# Patient Record
Sex: Female | Born: 1955 | Race: Black or African American | Hispanic: No | Marital: Single | Smoking: Never smoker
Health system: Southern US, Community
[De-identification: ages and names within clinical notes are randomized; demographics above are authoritative.]

## PROBLEM LIST (undated history)

## (undated) DIAGNOSIS — J45909 Unspecified asthma, uncomplicated: Secondary | ICD-10-CM

## (undated) HISTORY — PX: ABDOMINAL HYSTERECTOMY: SHX81

---

## 2004-07-04 ENCOUNTER — Emergency Department: Payer: Self-pay | Admitting: Emergency Medicine

## 2005-08-09 ENCOUNTER — Ambulatory Visit: Payer: Self-pay | Admitting: Family Medicine

## 2006-11-21 ENCOUNTER — Ambulatory Visit: Payer: Self-pay | Admitting: Internal Medicine

## 2009-06-01 ENCOUNTER — Ambulatory Visit: Payer: Self-pay | Admitting: Unknown Physician Specialty

## 2009-07-08 ENCOUNTER — Ambulatory Visit: Payer: Self-pay | Admitting: Unknown Physician Specialty

## 2009-07-13 ENCOUNTER — Ambulatory Visit: Payer: Self-pay | Admitting: Unknown Physician Specialty

## 2009-11-27 ENCOUNTER — Emergency Department: Payer: Self-pay | Admitting: Emergency Medicine

## 2010-08-15 ENCOUNTER — Ambulatory Visit: Payer: Self-pay | Admitting: Family Medicine

## 2011-04-20 ENCOUNTER — Ambulatory Visit: Payer: Self-pay
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
## 2011-04-23 ENCOUNTER — Emergency Department: Payer: Self-pay | Admitting: Emergency Medicine

## 2011-11-10 ENCOUNTER — Emergency Department: Payer: Self-pay | Admitting: Emergency Medicine
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
## 2012-05-24 ENCOUNTER — Emergency Department: Payer: Self-pay | Admitting: Internal Medicine

## 2013-12-15 ENCOUNTER — Ambulatory Visit: Payer: Self-pay | Admitting: Family Medicine

## 2014-04-19 ENCOUNTER — Emergency Department: Payer: Self-pay | Admitting: Emergency Medicine

## 2014-04-19 LAB — COMPREHENSIVE METABOLIC PANEL
Albumin: 3.7 g/dL (ref 3.4–5.0)
Alkaline Phosphatase: 104 U/L
Anion Gap: 10 (ref 7–16)
BUN: 11 mg/dL (ref 7–18)
Bilirubin,Total: 0.9 mg/dL (ref 0.2–1.0)
CREATININE: 1.47 mg/dL — AB (ref 0.60–1.30)
Calcium, Total: 12.3 mg/dL — ABNORMAL HIGH (ref 8.5–10.1)
Chloride: 111 mmol/L — ABNORMAL HIGH (ref 98–107)
Co2: 25 mmol/L (ref 21–32)
GFR CALC AF AMER: 47 — AB
GFR CALC NON AF AMER: 39 — AB
GLUCOSE: 83 mg/dL (ref 65–99)
OSMOLALITY: 289 (ref 275–301)
POTASSIUM: 3.9 mmol/L (ref 3.5–5.1)
SGOT(AST): 26 U/L (ref 15–37)
SGPT (ALT): 28 U/L
SODIUM: 146 mmol/L — AB (ref 136–145)
Total Protein: 8.2 g/dL (ref 6.4–8.2)

## 2014-04-19 LAB — CBC WITH DIFFERENTIAL/PLATELET
BASOS ABS: 0 10*3/uL (ref 0.0–0.1)
Basophil %: 0.6 %
Eosinophil #: 0.2 10*3/uL (ref 0.0–0.7)
Eosinophil %: 4.2 %
HCT: 35 % (ref 35.0–47.0)
HGB: 10.8 g/dL — ABNORMAL LOW (ref 12.0–16.0)
LYMPHS PCT: 19.6 %
Lymphocyte #: 1.1 10*3/uL (ref 1.0–3.6)
MCH: 25.6 pg — ABNORMAL LOW (ref 26.0–34.0)
MCHC: 31 g/dL — ABNORMAL LOW (ref 32.0–36.0)
MCV: 83 fL (ref 80–100)
Monocyte #: 0.5 x10 3/mm (ref 0.2–0.9)
Monocyte %: 9 %
NEUTROS ABS: 3.8 10*3/uL (ref 1.4–6.5)
Neutrophil %: 66.6 %
PLATELETS: 252 10*3/uL (ref 150–440)
RBC: 4.24 10*6/uL (ref 3.80–5.20)
RDW: 18.6 % — AB (ref 11.5–14.5)
WBC: 5.7 10*3/uL (ref 3.6–11.0)

## 2014-04-19 LAB — PROTIME-INR
INR: 2.5
PROTHROMBIN TIME: 26.6 s — AB (ref 11.5–14.7)

## 2014-04-19 LAB — URINALYSIS, COMPLETE
BILIRUBIN, UR: NEGATIVE
BLOOD: NEGATIVE
Bacteria: NONE SEEN
Glucose,UR: NEGATIVE mg/dL (ref 0–75)
LEUKOCYTE ESTERASE: NEGATIVE
NITRITE: NEGATIVE
PH: 5 (ref 4.5–8.0)
Protein: NEGATIVE
RBC,UR: 2 /HPF (ref 0–5)
Specific Gravity: 1.014 (ref 1.003–1.030)
WBC UR: 7 /HPF (ref 0–5)

## 2014-04-19 LAB — TROPONIN I: Troponin-I: 0.06 ng/mL — ABNORMAL HIGH

## 2014-04-19 LAB — MAGNESIUM: MAGNESIUM: 2.3 mg/dL

## 2014-04-19 LAB — TSH: Thyroid Stimulating Horm: 0.184 u[IU]/mL — ABNORMAL LOW

## 2014-04-19 LAB — PHOSPHORUS: PHOSPHORUS: 2.2 mg/dL — AB (ref 2.5–4.9)

## 2014-07-12 LAB — CBC WITH DIFFERENTIAL/PLATELET
BASOS ABS: 0 10*3/uL (ref 0.0–0.1)
BASOS PCT: 0.5 %
EOS PCT: 0.8 %
Eosinophil #: 0 10*3/uL (ref 0.0–0.7)
HCT: 39.8 % (ref 35.0–47.0)
HGB: 12.5 g/dL (ref 12.0–16.0)
LYMPHS PCT: 10.1 %
Lymphocyte #: 0.5 10*3/uL — ABNORMAL LOW (ref 1.0–3.6)
MCH: 25.5 pg — ABNORMAL LOW (ref 26.0–34.0)
MCHC: 31.4 g/dL — ABNORMAL LOW (ref 32.0–36.0)
MCV: 81 fL (ref 80–100)
MONOS PCT: 4.3 %
Monocyte #: 0.2 x10 3/mm (ref 0.2–0.9)
Neutrophil #: 4.5 10*3/uL (ref 1.4–6.5)
Neutrophil %: 84.3 %
PLATELETS: 188 10*3/uL (ref 150–440)
RBC: 4.91 10*6/uL (ref 3.80–5.20)
RDW: 15.9 % — ABNORMAL HIGH (ref 11.5–14.5)
WBC: 5.4 10*3/uL (ref 3.6–11.0)

## 2014-07-12 LAB — URINALYSIS, COMPLETE
Bacteria: NONE SEEN
Bilirubin,UR: NEGATIVE
Blood: NEGATIVE
Glucose,UR: NEGATIVE mg/dL (ref 0–75)
Leukocyte Esterase: NEGATIVE
NITRITE: NEGATIVE
PH: 7 (ref 4.5–8.0)
Protein: NEGATIVE
Specific Gravity: 1.01 (ref 1.003–1.030)

## 2014-07-12 LAB — COMPREHENSIVE METABOLIC PANEL
ALT: 192 U/L — AB
Albumin: 4 g/dL
Alkaline Phosphatase: 96 U/L
Anion Gap: 9 (ref 7–16)
BUN: 8 mg/dL
Bilirubin,Total: 2.2 mg/dL — ABNORMAL HIGH
CALCIUM: 9.8 mg/dL
Chloride: 107 mmol/L
Co2: 24 mmol/L
Creatinine: 0.86 mg/dL
GLUCOSE: 113 mg/dL — AB
Potassium: 3.6 mmol/L
SGOT(AST): 351 U/L — ABNORMAL HIGH
Sodium: 140 mmol/L
Total Protein: 7.5 g/dL

## 2014-07-12 LAB — TROPONIN I: Troponin-I: <0.03 ng/mL

## 2014-07-12 LAB — PROTIME-INR
INR: 1.7
PROTHROMBIN TIME: 20 s — AB

## 2014-07-12 LAB — APTT: Activated PTT: 46.2 secs — ABNORMAL HIGH (ref 23.6–35.9)

## 2014-07-12 LAB — LIPASE, BLOOD: LIPASE: 1490 U/L — AB

## 2014-07-12 IMAGING — US ABDOMEN ULTRASOUND LIMITED
1 series · 14 of 25 positions shown · non-contrast
Comparison: None.

CLINICAL DATA: Initial evaluation for acute epigastric and right
upper quadrant pain. History of prior gastric sleeve.

EXAM:
US ABDOMEN LIMITED - RIGHT UPPER QUADRANT

[Series 1: abdomen ultrasound limited · 0.22mm/px · 14 of 111 slices shown]
[im 1/111]
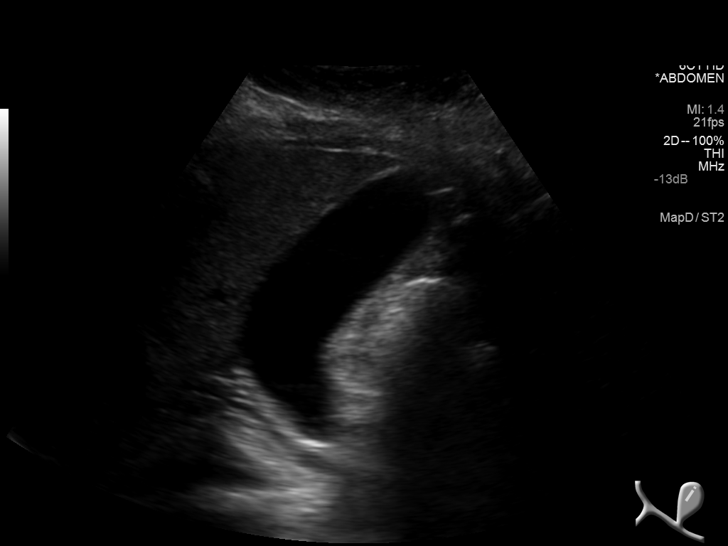
[im 10/111]
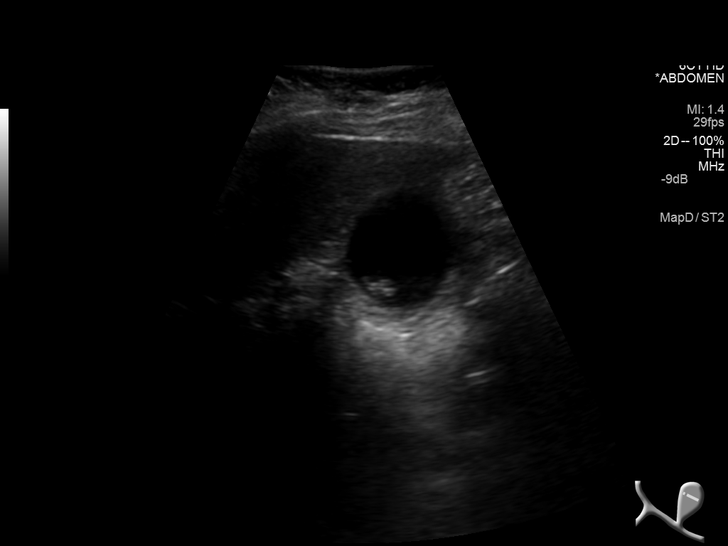
[im 19/111]
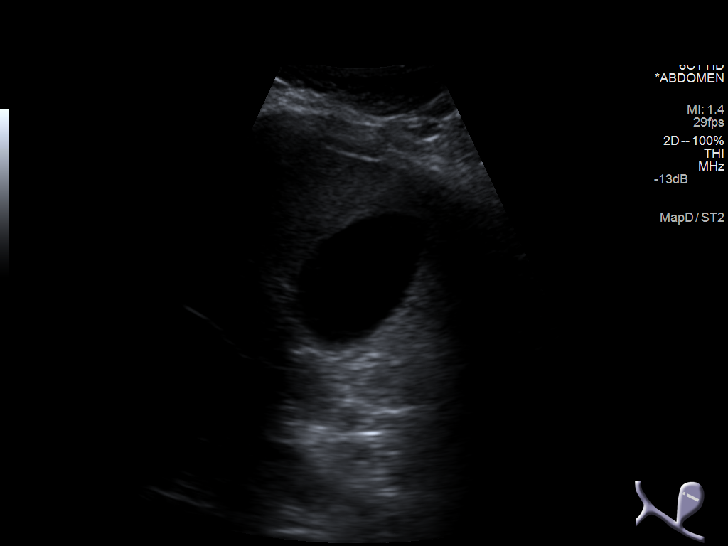
[im 28/111]
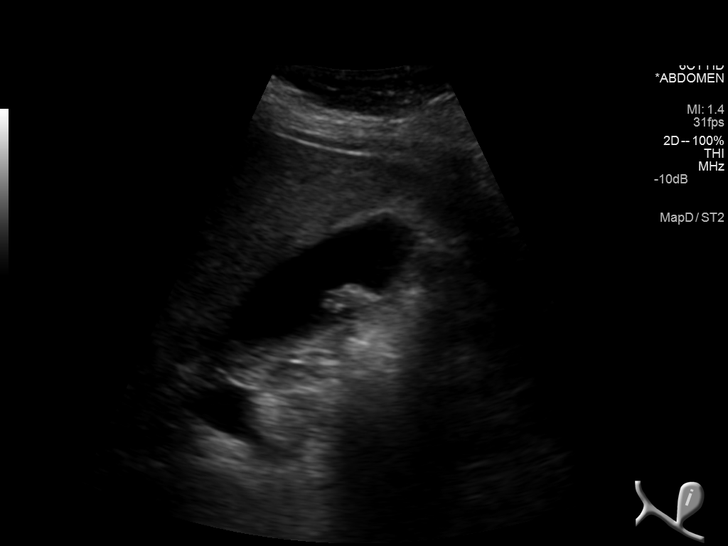
[im 37/111]
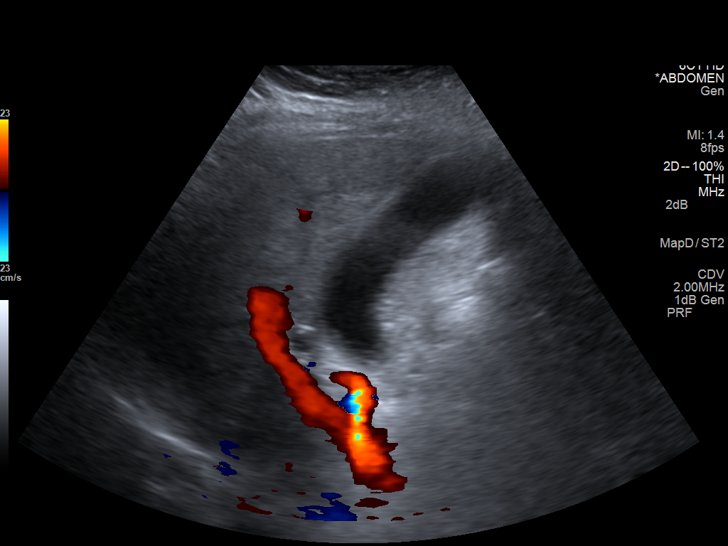
[im 42/111]
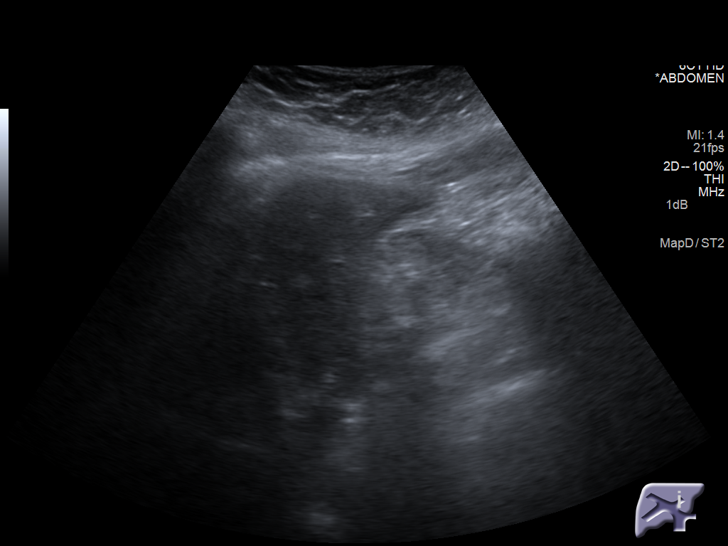
[im 51/111]
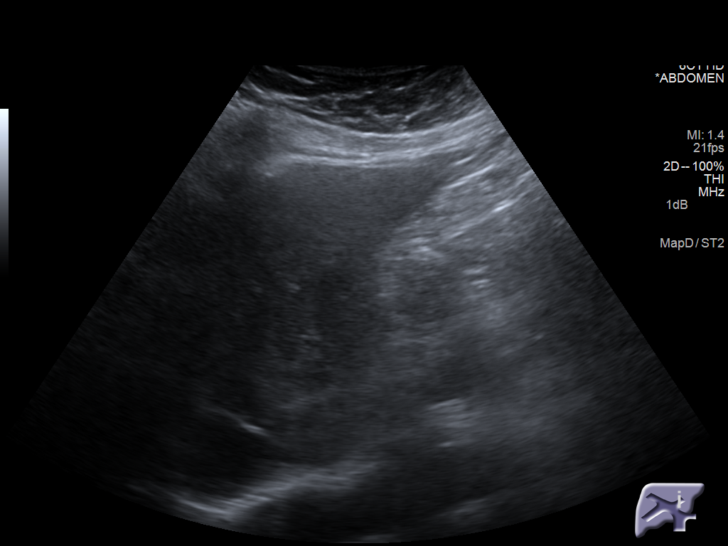
[im 60/111]
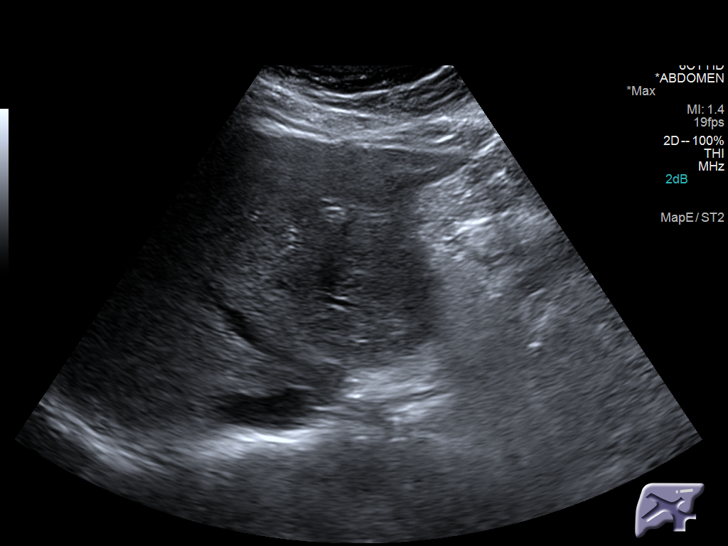
[im 69/111]
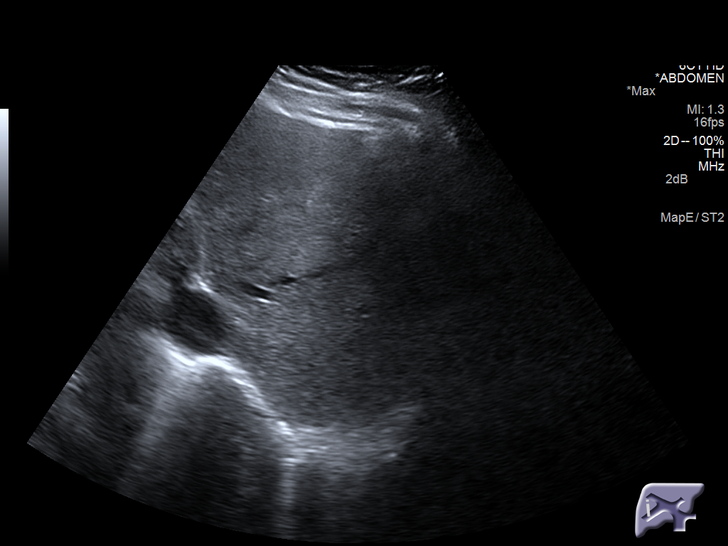
[im 74/111]
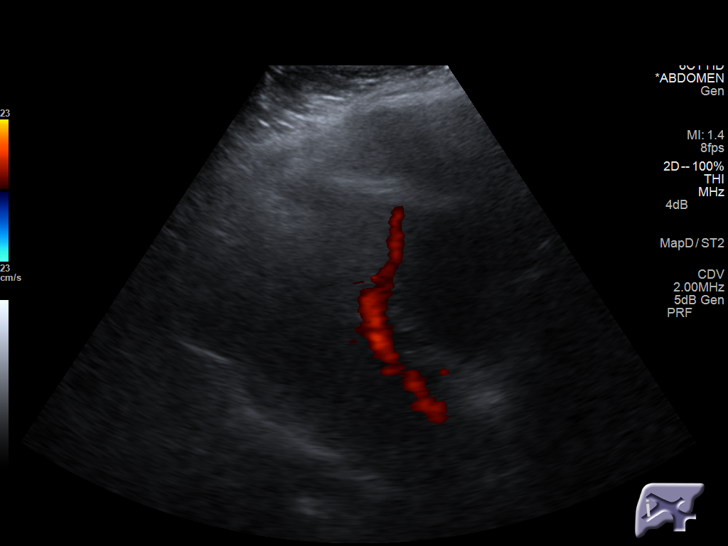
[im 83/111]
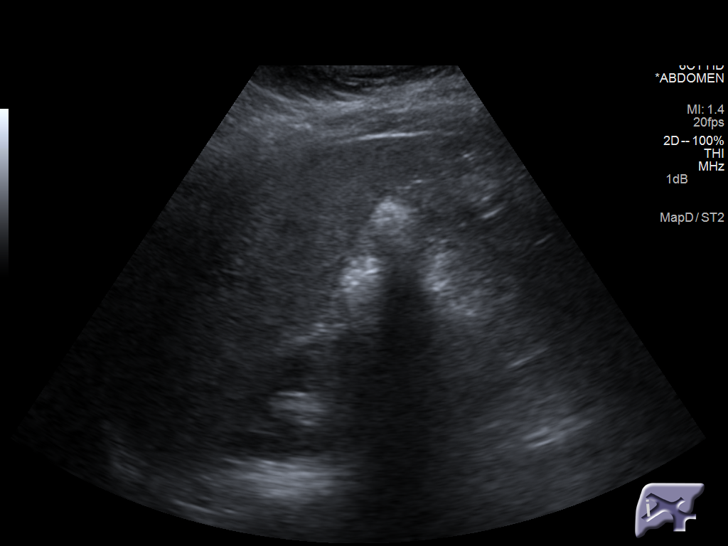
[im 92/111]
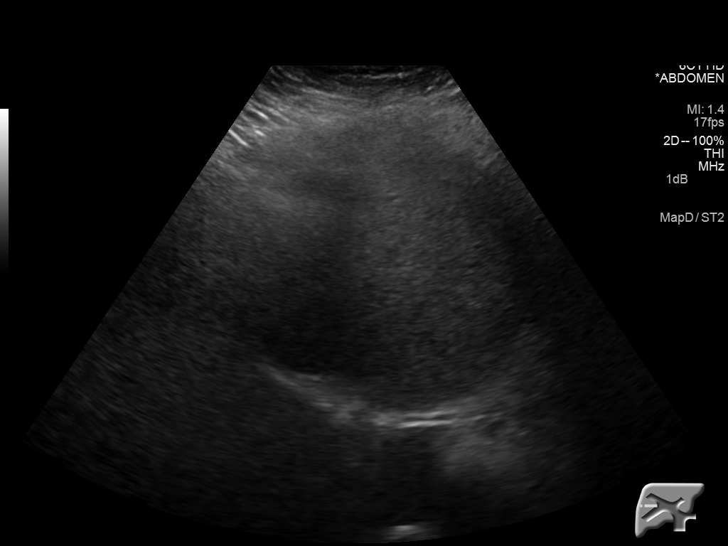
[im 101/111]
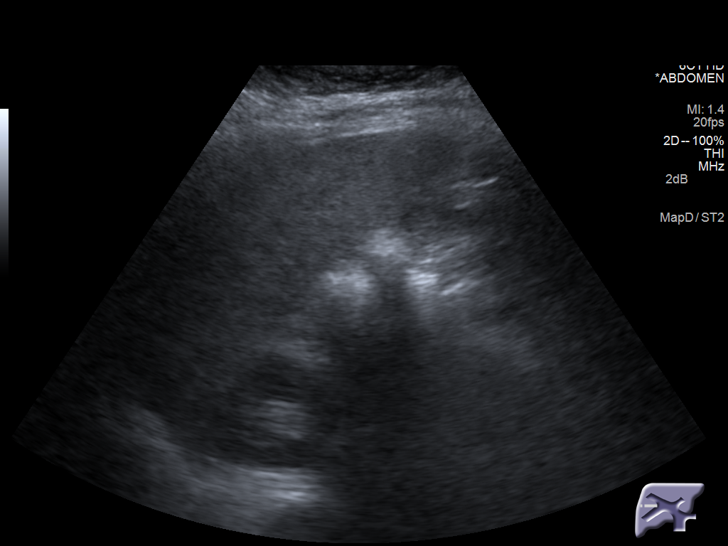
[im 111/111]
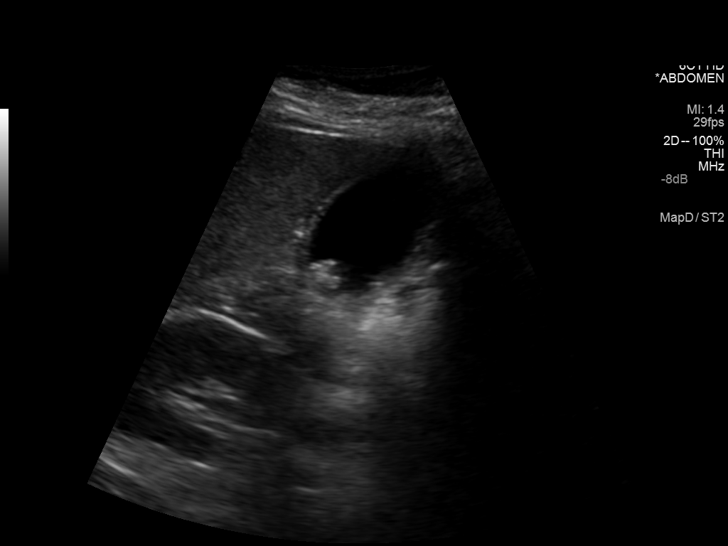

[14 of 25 positions shown; findings below may reference images not displayed]

FINDINGS: Gallbladder:

Multiple shadowing echogenic stones present within the gallbladder
lumen. The largest of these measured approximately 17 mm. No
gallbladder wall thickening. No free pericholecystic fluid. No
sonographic Murphy sign elicited on exam.

Common bile duct:

Diameter: 5.2 mm

Liver:

No focal lesion identified.  Diffusely increased echogenicity.
IMPRESSION: 1. Cholelithiasis without sonographic evidence for acute
cholecystitis or biliary dilatation.
2. Hepatic steatosis.

## 2014-07-13 ENCOUNTER — Inpatient Hospital Stay
Admit: 2014-07-13 | Disposition: A | Discharge status: Home / Self Care | Payer: Self-pay | Attending: Internal Medicine | Admitting: Internal Medicine

## 2014-07-13 LAB — HEPATIC FUNCTION PANEL A (ARMC)
ALBUMIN: 3.3 g/dL — AB
Alkaline Phosphatase: 91 U/L
BILIRUBIN DIRECT: 0.8 mg/dL — AB
BILIRUBIN INDIRECT: 0.9
Bilirubin,Total: 1.7 mg/dL — ABNORMAL HIGH
SGOT(AST): 226 U/L — ABNORMAL HIGH
SGPT (ALT): 167 U/L — ABNORMAL HIGH
TOTAL PROTEIN: 6.3 g/dL — AB

## 2014-07-13 IMAGING — US US EXTREM LOW VENOUS*L*
1 series · 14 of 24 positions shown · non-contrast
Comparison: None available

CLINICAL DATA: History of DVT and IVC filter placement. Pain,
edema. Recent fall on [REDACTED]; bruising.

EXAM:
LEFT LOWER EXTREMITY VENOUS DOPPLER ULTRASOUND
TECHNIQUE: Gray-scale sonography with compression, as well as color and duplex
ultrasound, were performed to evaluate the deep venous system from
the level of the common femoral vein through the popliteal and
proximal calf veins.

[Series 1: us extrem low venous*left* · 0.09mm/px · 14 of 47 slices shown]
[im 1/47]
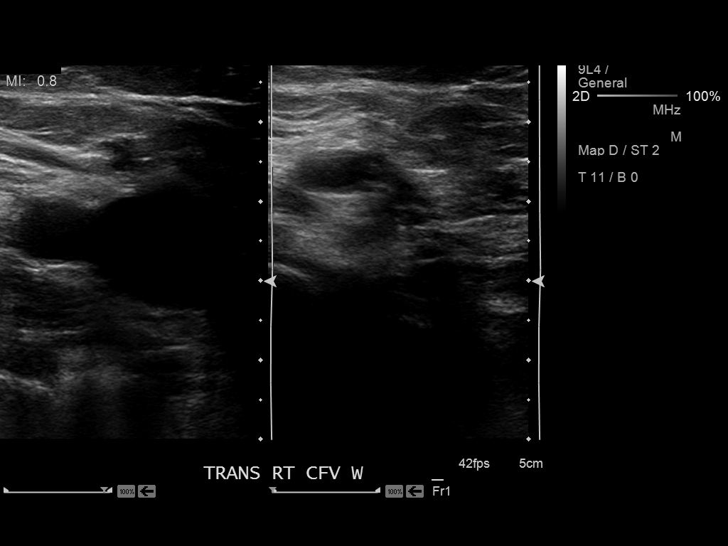
[im 5/47]
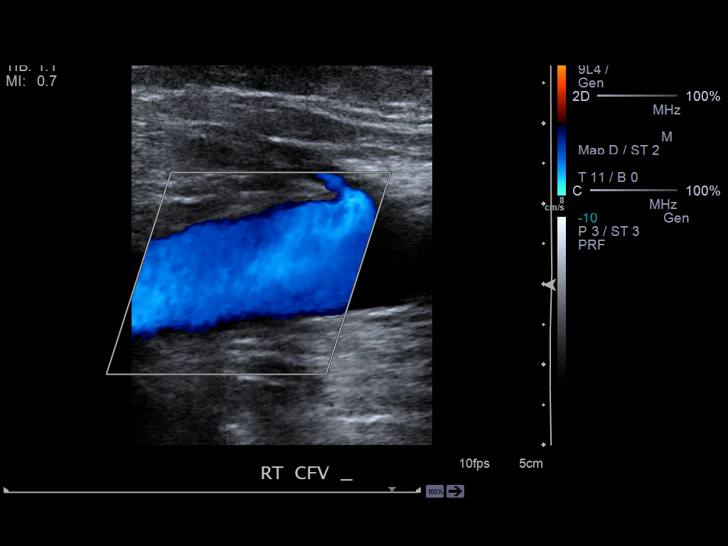
[im 9/47]
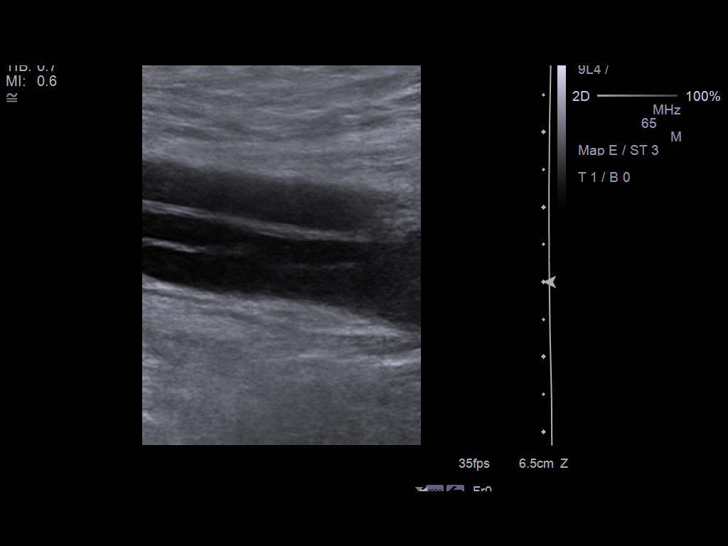
[im 13/47]
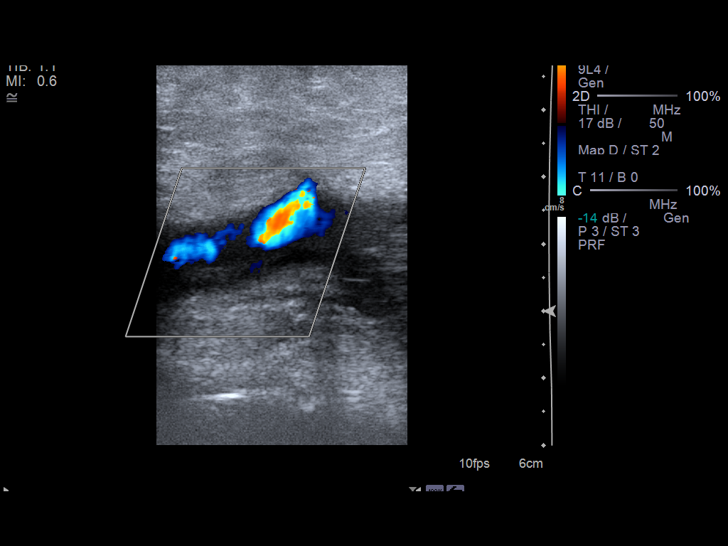
[im 15/47]
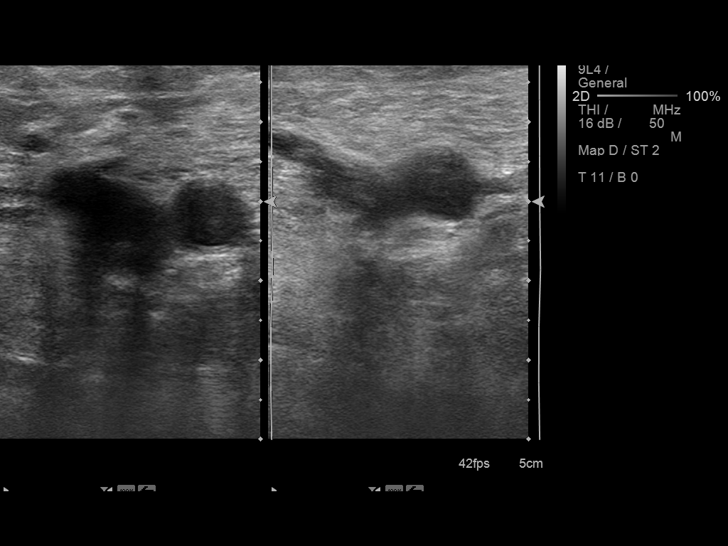
[im 19/47]
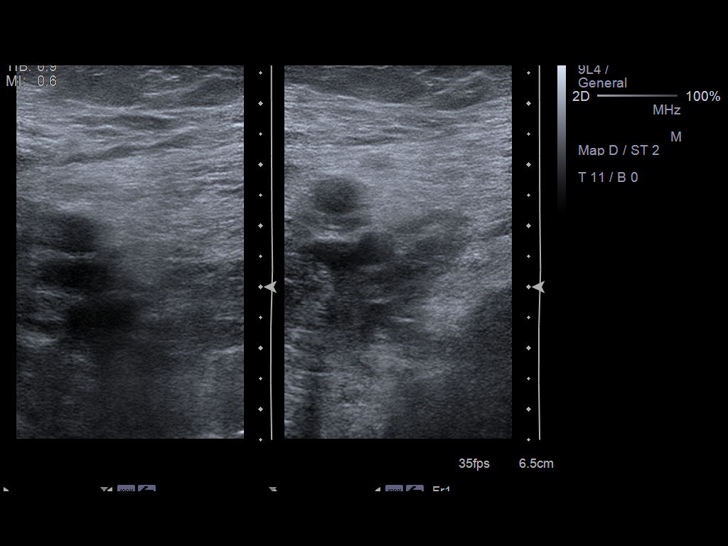
[im 23/47]
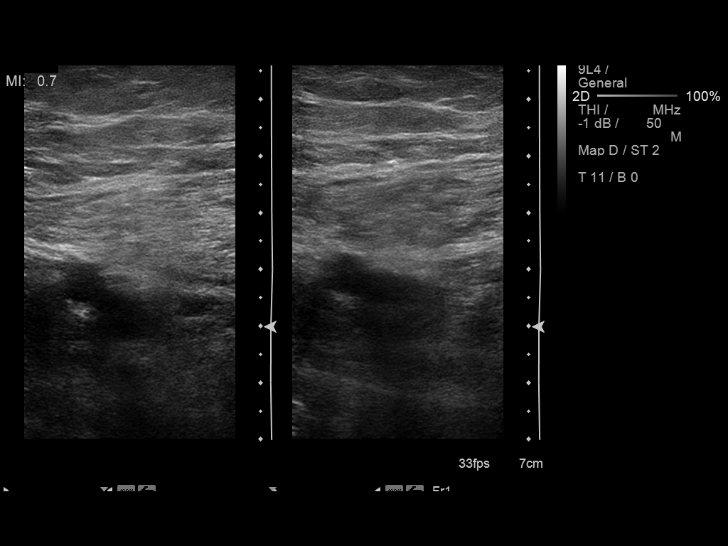
[im 25/47]
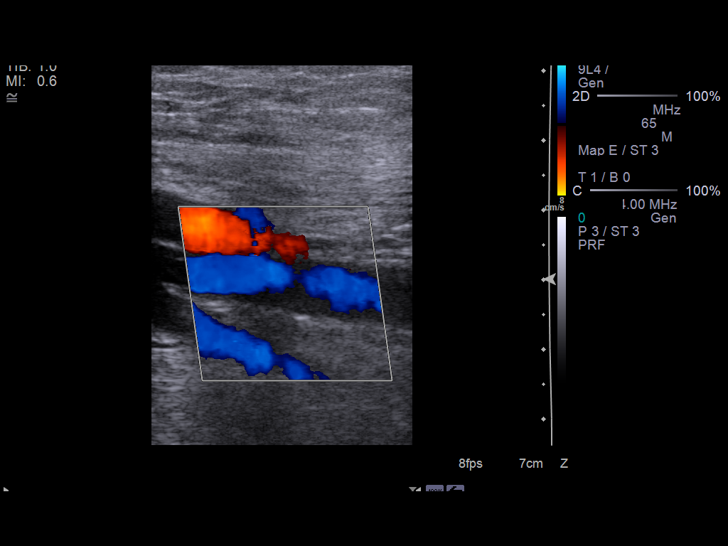
[im 29/47]
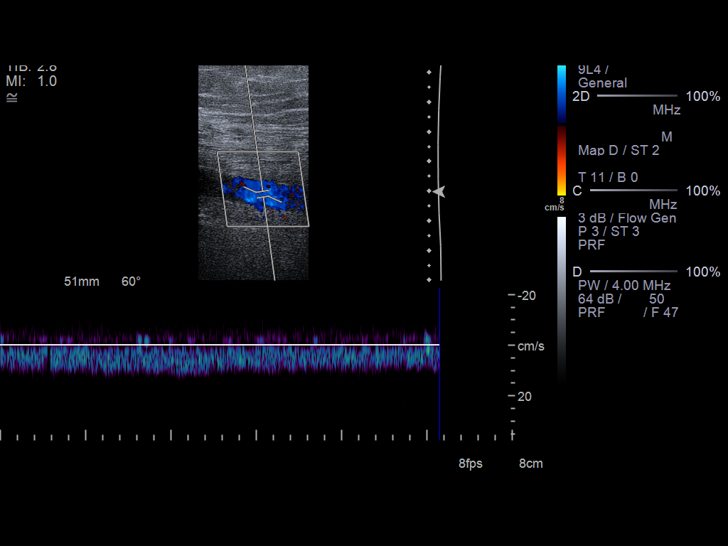
[im 33/47]
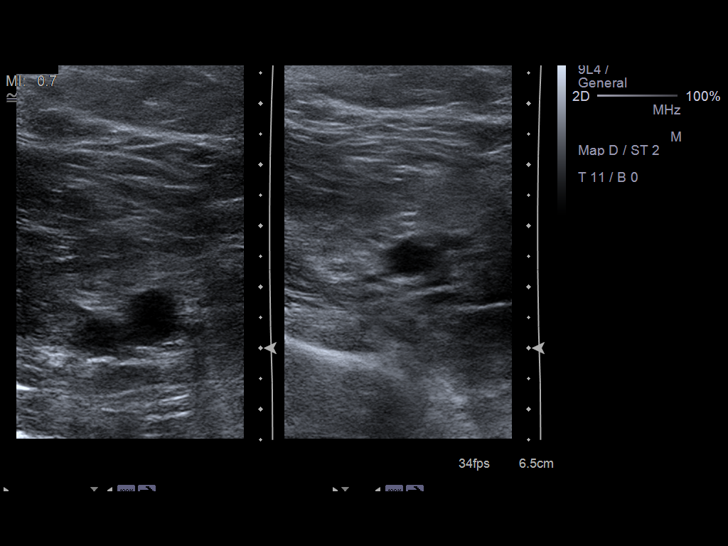
[im 37/47]
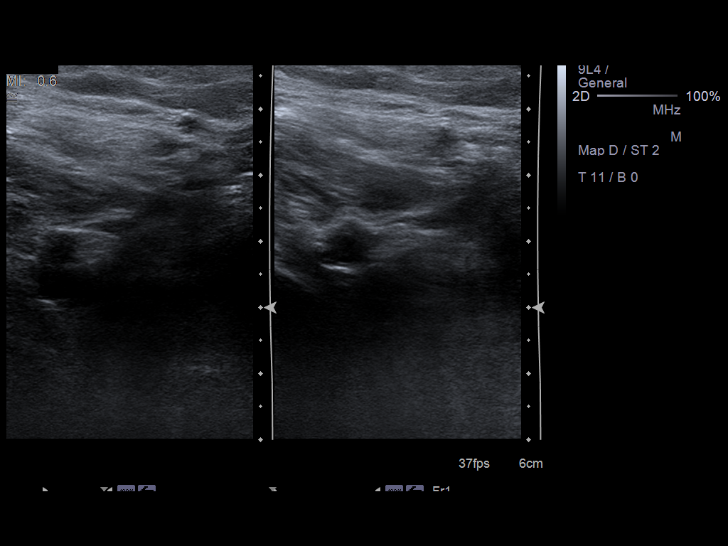
[im 39/47]
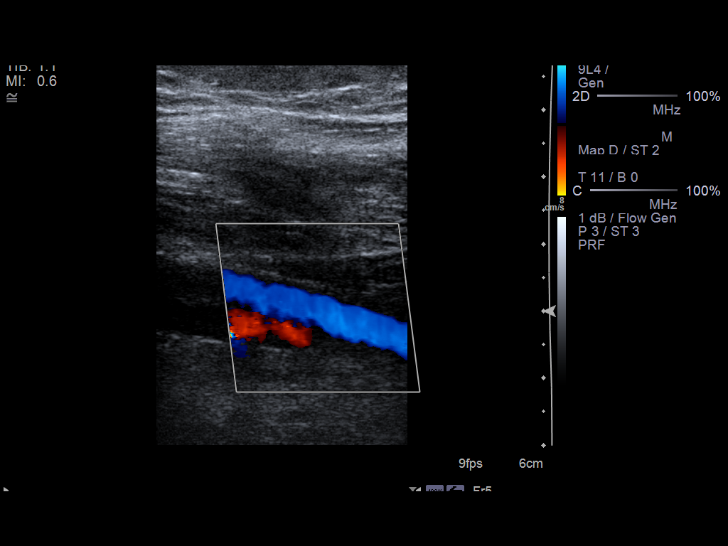
[im 43/47]
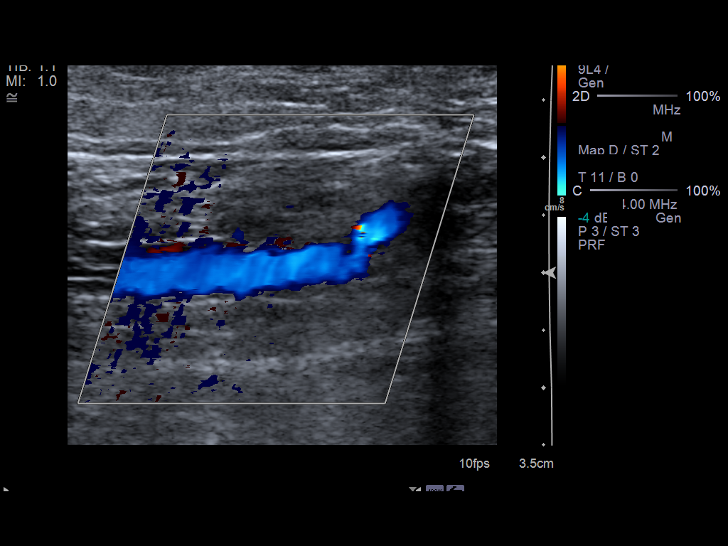
[im 47/47]
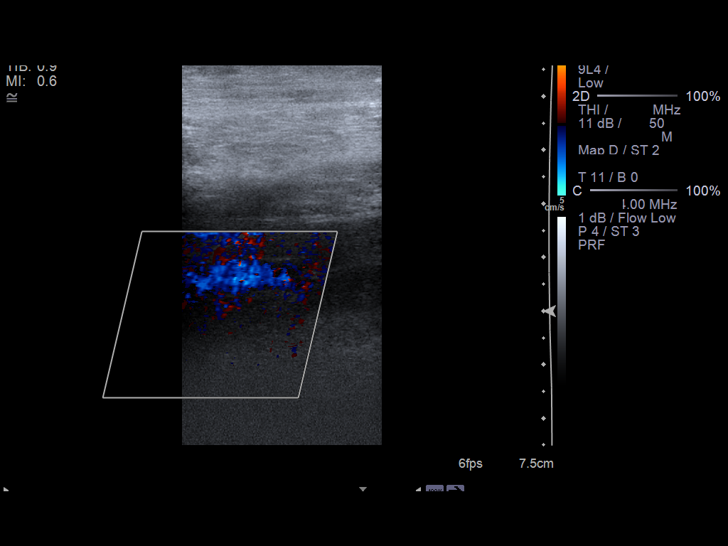

[14 of 24 positions shown; findings below may reference images not displayed]

FINDINGS: There is nonocclusive mural thrombus in the common femoral vein and
femoral vein. On color Doppler, continued flow seen through these
segments. The segments are incompletely compressible. Profunda
femoral and popliteal veins show normal compressibility with no
evidence of thrombus. Visualized calf veins unremarkable. No
evidence of reflux. Survey views of the contralateral common femoral
vein are unremarkable.
IMPRESSION: 1. Nonocclusive mural thrombus through the femoral and common
femoral veins.

## 2014-07-14 LAB — COMPREHENSIVE METABOLIC PANEL
ALK PHOS: 82 U/L
ANION GAP: 7 (ref 7–16)
Albumin: 3.1 g/dL — ABNORMAL LOW
BUN: 6 mg/dL
Bilirubin,Total: 1.1 mg/dL
CALCIUM: 8.9 mg/dL
CHLORIDE: 112 mmol/L — AB
CO2: 22 mmol/L
Creatinine: 0.86 mg/dL
EGFR (Non-African Amer.): >60
GLUCOSE: 59 mg/dL — AB
Potassium: 3.7 mmol/L
SGOT(AST): 122 U/L — ABNORMAL HIGH
SGPT (ALT): 128 U/L — ABNORMAL HIGH
Sodium: 141 mmol/L
Total Protein: 5.7 g/dL — ABNORMAL LOW

## 2014-07-14 LAB — LIPASE, BLOOD: Lipase: 69 U/L — ABNORMAL HIGH

## 2014-07-14 LAB — CBC WITH DIFFERENTIAL/PLATELET
Basophil #: 0 10*3/uL (ref 0.0–0.1)
Basophil %: 1 %
EOS ABS: 0.2 10*3/uL (ref 0.0–0.7)
Eosinophil %: 4.7 %
HCT: 34.7 % — ABNORMAL LOW (ref 35.0–47.0)
HGB: 11 g/dL — ABNORMAL LOW (ref 12.0–16.0)
LYMPHS ABS: 1 10*3/uL (ref 1.0–3.6)
Lymphocyte %: 29 %
MCH: 26 pg (ref 26.0–34.0)
MCHC: 31.8 g/dL — AB (ref 32.0–36.0)
MCV: 82 fL (ref 80–100)
MONO ABS: 0.2 x10 3/mm (ref 0.2–0.9)
Monocyte %: 7.2 %
NEUTROS PCT: 58.1 %
Neutrophil #: 1.9 10*3/uL (ref 1.4–6.5)
PLATELETS: 159 10*3/uL (ref 150–440)
RBC: 4.24 10*6/uL (ref 3.80–5.20)
RDW: 16.1 % — AB (ref 11.5–14.5)
WBC: 3.3 10*3/uL — ABNORMAL LOW (ref 3.6–11.0)

## 2014-07-14 LAB — HEPARIN LEVEL (UNFRACTIONATED): Anti-Xa(Unfractionated): 0.12 IU/mL — ABNORMAL LOW (ref 0.30–0.70)

## 2014-07-15 LAB — CBC WITH DIFFERENTIAL/PLATELET
BASOS PCT: 0.9 %
Basophil #: 0 10*3/uL (ref 0.0–0.1)
Eosinophil #: 0.2 10*3/uL (ref 0.0–0.7)
Eosinophil %: 5.6 %
HCT: 33.6 % — AB (ref 35.0–47.0)
HGB: 10.5 g/dL — ABNORMAL LOW (ref 12.0–16.0)
LYMPHS PCT: 41.2 %
Lymphocyte #: 1.7 10*3/uL (ref 1.0–3.6)
MCH: 25.5 pg — ABNORMAL LOW (ref 26.0–34.0)
MCHC: 31.3 g/dL — ABNORMAL LOW (ref 32.0–36.0)
MCV: 82 fL (ref 80–100)
MONOS PCT: 8.1 %
Monocyte #: 0.3 x10 3/mm (ref 0.2–0.9)
NEUTROS PCT: 44.2 %
Neutrophil #: 1.8 10*3/uL (ref 1.4–6.5)
Platelet: 151 10*3/uL (ref 150–440)
RBC: 4.12 10*6/uL (ref 3.80–5.20)
RDW: 15.6 % — ABNORMAL HIGH (ref 11.5–14.5)
WBC: 4.2 10*3/uL (ref 3.6–11.0)

## 2014-07-15 LAB — HEPARIN LEVEL (UNFRACTIONATED)
ANTI-XA(UNFRACTIONATED): 0.85 [IU]/mL — AB (ref 0.30–0.70)
Anti-Xa(Unfractionated): 0.57 IU/mL (ref 0.30–0.70)

## 2014-07-25 NOTE — Consult Note (Signed)
Chief Complaint:  Subjective/Chief Complaint Pt reports 2/10 RUQ pain.  She denies nausea or vomiting.  Feels much better.  Pain not worse with eating.   VITAL SIGNS/ANCILLARY NOTES: **Vital Signs.:   21-Apr-16 07:58  Vital Signs Type Q 8hr  Celsius 36.5  Temperature Source oral  Pulse Pulse 56  Respirations Respirations 17  Systolic BP Systolic BP 156  Diastolic BP (mmHg) Diastolic BP (mmHg) 99  Mean BP 118  Pulse Ox % Pulse Ox % 100  Pulse Ox Activity Level  At rest  Oxygen Delivery Room Air/ 21 %   Brief Assessment:  GEN well developed, well nourished, no acute distress, A/Ox3   Respiratory normal resp effort   Gastrointestinal details normal Soft  Nontender  Nondistended  No masses palpable  Bowel sounds normal  No rebound tenderness  No gaurding  No rigidity   EXTR negative cyanosis/clubbing, negative edema   Additional Physical Exam Skin: warm, dry, intact   Lab Results: Routine Hem:  21-Apr-16 02:25   WBC (CBC) 4.2  RBC (CBC) 4.12  Hemoglobin (CBC)  10.5  Hematocrit (CBC)  33.6  Platelet Count (CBC) 151  MCV 82  MCH  25.5  MCHC  31.3  RDW  15.6  Neutrophil % 44.2  Lymphocyte % 41.2  Monocyte % 8.1  Eosinophil % 5.6  Basophil % 0.9  Neutrophil # 1.8  Lymphocyte # 1.7  Monocyte # 0.3  Eosinophil # 0.2  Basophil # 0.0 (Result(s) reported on 15 Jul 2014 at 02:46AM.)   Assessment/Plan:  Assessment/Plan:  Assessment 1st episode acute pancreatitis:  Resolved   Plan 1) PPI daily 2) [facility] 3) To ER if severe pain returns ease call if you have any questions or concerns   Electronic Signatures: Joselyn ArrowJones, Timothea Bodenheimer L (NP)  (Signed 21-Apr-16 10:35)  Authored: Chief Complaint, VITAL SIGNS/ANCILLARY NOTES, Brief Assessment, Lab Results, Assessment/Plan   Last Updated: 21-Apr-16 10:35 by Joselyn ArrowJones, Mishaal Lansdale L (NP)

## 2014-07-25 NOTE — Consult Note (Signed)
Brief Consult Note: Diagnosis: biliary pancreatitis.   Patient was seen by consultant.   Consult note dictated.   Recommend further assessment or treatment.   Discussed with Attending MD.   Comments: Pt has had gastric sleeve and parathyroide[facility] in last 3 months, [facility]. Needs admission for anticoag management and pain control until stone passes then will need ERCP/lap chole. Pt [facility] Available if needed.  Electronic Signatures: Lattie Hawooper, Edrees Valent E (MD)  (Signed 19-Apr-16 04:17)  Authored: Brief Consult Note   Last Updated: 19-Apr-16 04:17 by Lattie Hawooper, Hermen Mario E (MD)

## 2014-07-25 NOTE — Consult Note (Signed)
Brief Consult Note: Diagnosis: Acute pancreatitis.   Patient was seen by consultant.   Comments: Ms. Diane Calderon is a very pleasant 59 y/o female with 1st episode acute pancreatitis given severe epigastric/upper abdominal pain, nausea & elevated lipase 1490.  She has cholelithiasis without any evidence of choledocholithiasis, however Bili increased to 2.2.  I suspect she passed sludge or a stone and she has biliary pancreatitis.  She is high risk due to recent DVT 03/2014 [facility] & she is 2.5 weeks s/p [facility] on coumadin for past few days after completing lovenox bridge.  Heparin had been ordered here, however given risk of death from hemorrhagic pancreatitis, I discussed this with both Dr Servando SnareWohl & Dr Luberta MutterKonidena.  Recommendation would be to discontinue heparin if at all possible without comprominsing risks of DVT.  Dr Luberta MutterKonidena will do dopplers to look for DVT.  Pt has temporary IVC filter & if not contraindicated could have SCDS.  Pt will likely ultimately need cholecystectomy to prevent recurrence.  Plan: 1) LFTs today 2) Clear liquids 3) Aggressive IVFs 4) DC heparin if at all possible  5) Contine supportive measures 6) If BILI continues to climb, she will need MRCP Thanks for allowing us to participate in her care.  Please see full dictated note. #[id number] Please call if you have any questions or concerns.  Electronic Signatures: Joselyn ArrowJones, Gurkirat Basher L (NP)  (Signed 19-Apr-16 11:00)  Authored: Brief Consult Note   Last Updated: 19-Apr-16 11:00 by Joselyn ArrowJones, Cay Kath L (NP)

## 2014-07-25 NOTE — Consult Note (Signed)
PATIENT NAME:  Diane Calderon, Diane Calderon MR#:  [id number] DATE OF BIRTH:  [birth year]  DATE OF CONSULTATION:  07/13/2014  REFERRING PHYSICIAN:   CONSULTING PHYSICIAN:  Joselyn Arrow, NP  PRIMARY CARE PHYSICIAN:  Dr. Hurman Horn at [facility].  CONSULTING GASTROENTEROLOGIST:  Midge Minium, M.D.   BARIATRIC SURGEON:  Dr. Artist Pais at [facility]  REASON FOR CONSULTATION: Acute pancreatitis.   HISTORY OF PRESENT ILLNESS: Diane Calderon is a 59 year old female who has had a difficult course since last December when she had a gastric sleeve placed. She underwent surgery on 13/30/2015 at [facility]. Since that time, she developed a DVT of the left lower extremity. She was started on Coumadin and a temporary IVC filter was placed. She began to have intermittent epigastric and right upper quadrant pain. She has been seen by her bariatric surgeon as well as her primary care provider. She did have an ultrasound in February which showed cholelithiasis, but was otherwise unremarkable. She tells yesterday she was working and she had a bout of severe pain that felt like gas pain. The pain was 8 out of 10 and intermittent. The pain was unrelenting so she presented to the Emergency Department.  She had nausea with the pain, but denies any vomiting. The pain is in her upper abdomen, radiates to both sides, as well as through to her back. She has lost about 40 pounds since her bariatric surgery. She had been taking Lovenox injections until last Friday as she had a parathyroidectomy April 1 at [facility] for a left parathyroid adenoma. She has never had pancreatitis before. She was admitted with a lipase of 1490 yesterday. Her total bilirubin was up to 2.2. Her AST 351 and ALT 192. Her INR was 1.7. Her CBC was normal. Her alkaline phosphatase was normal. Her urinalysis showed 1+ ketones. Ultrasound showed cholelithiasis and hepatic steatosis.  The common bile duct was 5.2 mm without any evidence of choledocholithiasis. She had been started on  heparin at the time of my hospital visit.  She denies any alcohol use.  She denies any new medications.  PAST MEDICAL AND SURGICAL HISTORY: Status post parathyroid surgery by Dr. Casilda Carls 06/25/2014.  She had an EGD in November 2015 by her bariatric surgeon which she describes as normal.  She had a gastric sleeve placed on 03/24/2014 by Dr. Artist Pais at [facility]. She has a history of lupus, hypertension, hypothyroidism, partial hysterectomy, C-section, IVC filter, left lower extremity DVT, colon polyps with colonoscopy due this year. She believes her last colonoscopy was in 2011.  Asthma.  MEDICATIONS PRIOR TO ADMISSION: Albuterol 90 mcg 2 puffs every 6 hours, aspirin 81 mg daily, Coumadin 5 mg at bedtime, levothyroxine 150 mcg daily, losartan 50 mg daily, omeprazole 40 mg daily and Symbicort 160/1.5 mcg 2 puffs b.i.d.   ALLERGIES: No known drug allergies.   FAMILY HISTORY: Positive for a sister with colon cancer in her 62s.   SOCIAL HISTORY: She is single. She is a Museum/gallery exhibitions officer. She has 3 healthy sons. She denies any tobacco, alcohol, or illicit drug use.   REVIEW OF SYSTEMS:  See HPI, otherwise negative complete review.   PHYSICAL EXAMINATION:  VITAL SIGNS:  Weight 198 pounds, BMI 34, height 63.9 inches, temperature 97.7, pulse 62, respirations 18, blood pressure 138/85, oxygen saturation 98% on room air.  GENERAL: She is a well-developed, well-nourished, white female who is alert, oriented, cooperative in no distress.  HEENT: Sclerae clear, anicteric conjunctivae.  Oropharynx pink and moist without any lesions.  NECK: Supple without any mass or thyromegaly. HEART:  Regular rate and rhythm. Normal S1, S2. No murmurs, clicks, rubs, or gallops.  LUNGS: Clear to auscultation bilaterally.  ABDOMEN: Positive bowel sounds x 4. No bruits auscultated. Abdomen is soft, nondistended. She does have moderate tenderness to [facility] point and right upper quadrant as well as left upper quadrant. There is  no rebound tenderness or guarding. No hepatosplenomegaly or mass.  EXTREMITIES: Without clubbing or edema.  SKIN: Warm and dry without any rash or jaundice.  NEUROLOGIC: Grossly intact.  MUSCULOSKELETAL: Good equal movement and strength bilaterally.  PSYCHIATRIC: Alert, cooperative, pleasant, normal mood and affect.   LABORATORY STUDIES: See HPI. Glucose 113, otherwise normal basic metabolic panel. Troponin was negative. CBC normal except MCH of 25.5 and MCHC of 31.4.  IMPRESSION: Diane Calderon is a 59 year old female with her 1st episode of acute pancreatitis given severe epigastric and upper abdominal pain, nausea, elevated lipase of 1490. She has cholelithiasis without any evidence of choledocholithiasis; however, her bilirubin increased to 2.2. I suspect that she has a significant transaminitis. I suspect she passed sludge or a stone and she has biliary pancreatitis. She is high risk due to her recent deep vein thrombosis January 2016 status post [facility] and she is 2 1/2 weeks status post par[facility] on Coumadin for the past few days after completing a Lovenox bridge. Heparin was ordered here; however, given the risk of death from hemorrhagic pancreatitis I have discussed this with both Dr. Servando SnareWohl and Dr. Luberta MutterKonidena. Recommendation would be to discontinue heparin if at all possible without compromising her risk of deep vein thrombosis. Dr. Luberta MutterKonidena will to Doppler ultrasound to look for persistent deep vein thromboses.  The patient has a temporary IVC filter and if not contraindicated she could have sequential compression devices for management. The patient will likely ultimately need cholecystectomy to prevent recurrence. She would prefer to have [facility] given her recent surgery there.  PLAN:   1.  Liver function tests today. 2.  Clear liquids. 3.  Aggressive IV fluids. 4.  Discontinue heparin if at all possible.  5. Continue supportive measures.   6.  If bilirubin continues to climb, she will need an MRCP as the next step.  Thank you for allowing us to participate in her care.   ____________________________ Joselyn ArrowKandice L. Akisha Sturgill, NP klj:sp D: 07/13/2014 10:59:55 ET T: 07/13/2014 11:13:21 ET JOB#: [id number]  cc: Joselyn ArrowKandice L. Bassem Bernasconi, NP, <Dictator> Dr. Solmon IceMounsey Ryna Beckstrom L [place] Fant FNP ELECTRONICALLY SIGNED 07/13/2014 16:50

## 2014-07-25 NOTE — Consult Note (Signed)
Chief Complaint:  Subjective/Chief Complaint Pt reports no pain as long as she has pain meds.  She denies nausea or vomiting.  Feels much better.  Still with some epigastric discomfort when she drink liquids.   VITAL SIGNS/ANCILLARY NOTES: **Vital Signs.:   20-Apr-16 07:37  Vital Signs Type Q 8hr  Temperature Temperature (F) 97.8  Temperature Source oral  Pulse Pulse 70  Respirations Respirations 18  Systolic BP Systolic BP 856  Diastolic BP (mmHg) Diastolic BP (mmHg) 76  Mean BP 105  Pulse Ox % Pulse Ox % 98  Pulse Ox Activity Level  At rest  Oxygen Delivery Room Air/ 21 %   Brief Assessment:  GEN well developed, well nourished, no acute distress, A/Ox3   Respiratory normal resp effort   Gastrointestinal details normal Soft  Nontender  Nondistended  No masses palpable  Bowel sounds normal  No rebound tenderness  No gaurding  No rigidity   EXTR negative cyanosis/clubbing, negative edema   Additional Physical Exam Skin: warm, dry, intact   Lab Results: Hepatic:  20-Apr-16 05:53   Bilirubin, Total 1.1 (0.3-1.2 NOTE: New Reference Range  06/01/14)  Alkaline Phosphatase 82 (38-126 NOTE: New Reference Range  06/01/14)  SGPT (ALT)  128 (14-54 NOTE: New Reference Range  06/01/14)  SGOT (AST)  122 (15-41 NOTE: New Reference Range  06/01/14)  Total Protein, Serum  5.7 (6.5-8.1 NOTE: New Reference Range  06/01/14)  Albumin, Serum  3.1 (3.5-5.0 NOTE: New reference range  06/01/14)  Routine Chem:  20-Apr-16 05:53   Glucose, Serum  59 (65-99 NOTE: New Reference Range  06/01/14)  BUN 6 (6-20 NOTE: New Reference Range  06/01/14)  Creatinine (comp) 0.86 (0.44-1.00 NOTE: New Reference Range  06/01/14)  Sodium, Serum 141 (135-145 NOTE: New Reference Range  06/01/14)  Chloride, Serum  112 (101-111 NOTE: New Reference Range  06/01/14)  CO2, Serum 22 (22-32 NOTE: New Reference Range  06/01/14)  Calcium (Total), Serum 8.9 (8.9-10.3 NOTE: New Reference Range  06/01/14)  eGFR (African American) >60  eGFR (Non-African American) >60 (eGFR values <48m/min/1.73 m2 may be an indication of chronic kidney disease (CKD). Calculated eGFR is useful in patients with stable renal function. The eGFR calculation will not be reliable in acutely ill patients when serum creatinine is changing rapidly. It is not useful in patients on dialysis. The eGFR calculation may not be applicable to patients at the low and high extremes of body sizes, pregnant women, and vegetarians.)  Anion Gap 7  Lipase  69 (22-51 NOTE: New Reference Range  06/01/14)  Routine Hem:  20-Apr-16 05:53   WBC (CBC)  3.3  RBC (CBC) 4.24  Hemoglobin (CBC)  11.0  Hematocrit (CBC)  34.7  Platelet Count (CBC) 159  MCV 82  MCH 26.0  MCHC  31.8  RDW  16.1  Neutrophil % 58.1  Lymphocyte % 29.0  Monocyte % 7.2  Eosinophil % 4.7  Basophil % 1.0  Neutrophil # 1.9  Lymphocyte # 1.0  Monocyte # 0.2  Eosinophil # 0.2  Basophil # 0.0 (Result(s) reported on 14 Jul 2014 at 06:58AM.)   Assessment/Plan:  Assessment/Plan:  Assessment 1st episode acute pancreatitis:  Improving.  Likely biliary given resolving transaminitis/hyperbilirubinemia so I suspect she passed sludge or a stone.  Dilemma is she is high risk due to persistent left lower extremity DVT [facility]  She can resume coumadin now that her pancreatitis is resolving & understands risks of both DVT & hemorrhagic pancreatitis on anticoagulation.  At this time, it is felt that benefits of coumadin outweigh risks.  I have discussed her care with Dr Evangeline Gula [facility] & our plan of care is below.   Plan 1) Continue supportive measures 2) Follow LFTS to baseline 3) May resume coumadin now Please call if you have any questions or concerns   Electronic Signatures: Andria Meuse (NP)  (Signed 20-Apr-16 09:23)  Authored: Chief Complaint, VITAL SIGNS/ANCILLARY NOTES, Brief Assessment, Lab Results, Assessment/Plan   Last  Updated: 20-Apr-16 09:23 by Andria Meuse (NP)

## 2014-07-25 NOTE — Consult Note (Signed)
PATIENT NAME:  Diane Calderon, Diane Calderon MR#:  [id number] DATE OF BIRTH:  09/14/1955  DATE OF CONSULTATION:  07/13/2014  CHIEF COMPLAINT: Abdominal pain.   HISTORY OF PRESENT ILLNESS: This is a patient was several months of abdominal pain. She thinks it started right after she had a gastric sleeve procedure performed at [facility] in December of 2015. She has been having abdominal pain off and on since then, but yesterday started having severe epigastric pain radiating to her right upper quadrant and her back. She has been nauseated, has not vomited. Denies fevers or chills. Denies melena or hematochezia or hematemesis.   She denies jaundice or acholic stools. She thinks her urine has been dark, however.   Significant additional history is that the patient has had not only a gastric sleeve procedure, but a recent parathyroidectomy for hypercalcemia with a deep venous thrombosis requiring filter placement and anticoagulation. She is currently on Coumadin.   PAST MEDICAL HISTORY: Morbid obesity, hypertension, deep vein thrombosis in the left leg coagulation.   PAST SURGICAL HISTORY: Recent parathyroidectomy for hypercalcemia, recent gastric sleeve procedure for morbid obesity, cesarean section and tubal ligation.   ALLERGIES: None.   MEDICATIONS: Multiple, see chart. Currently on Coumadin.   FAMILY HISTORY: No known history of gallstones.   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 Does not smoke or drink.   REVIEW OF SYSTEMS: A complete system review was performed and negative with the exception of that mentioned in the history of present illness.   PHYSICAL EXAMINATION:  GENERAL: Obese American female patient, BMI of 34. Weight is 198. She appears uncomfortable in general.  VITAL SIGNS: Temperature of 97.9 and a pulse 56 respirations 16, blood pressure 121/66, 100% room air sat.  HEENT: No scleral icterus.  NECK: No palpable neck nodes.  CHEST: Clear to auscultation.   CARDIAC: Regular rate and rhythm.  ABDOMEN: Soft. Multiple scars are noted. She also has multiple hematomas and ecchymosis from prior Lovenox administration. She is tender in the epigastrium with a positive Murphy's sign. No CVA tenderness.  EXTREMITIES: Show moderate edema of the left leg, none on the right.  NEUROLOGIC: Grossly intact. INTEGUMENT: No jaundice.   An ultrasound shows gallstones with no ductal dilatation.   PTT is 46 and a PT of 20. INR of 1.7. White blood cell count is 5.4, hemoglobin and hematocrit of 12.5 and 40 and a platelet count of 188, lipase is 1490, AST and ALT of 351 and 192 respectively, alkaline phosphatase 96. Bilirubin 2.2. Electrolytes are within normal limits.   ASSESSMENT AND PLAN: This is a patient with likely biliary pancreatitis. She is anticoagulated. She also has a filter in place, but is nauseated to where she cannot take her medications. I discussed with the patient and with Dr. Manson Passey the need for admission to the hospital because of her management of pain, nausea and anticoagulation for a recent deep vein thrombosis with filter placement.   I did discuss with her the need for laparoscopic cholecystectomy at some point and the potential for an ERCP, however. She is currently anticoagulated and she has pancreatitis. Therefore, I would allow the pancreatitis to resolve prior to planning any sort of surgery. The patient understands that surgery would be elective and only to prevent future attacks. This was reviewed for her in detail, at which point she suggested to me that if she was to have any kind of surgery, she would have it done at [facility] because that  is where she has had 2 major surgeries in the last 3 months and would prefer any further procedures [facility] with a regular surgeons nearby. I reviewed for her the need for admission tonight and that internal medicine would likely do that. There would be no emergent surgical at this point,  and I can be available if needed, but again, she prefers to [facility]. Therefore, we will be happy to follow the patient while she is in the hospital until resolution of her pancreatitis and referral back to [facility] for definitive surgery. This was discussed with the patient and with Dr. Manson PasseyBrown.  ____________________________ Adah Salvageichard E. Excell Seltzerooper, MD rec:AT D: 07/13/2014 04:21:28 ET T: 07/13/2014 08:08:46 ET JOB#: [id number]  cc: Adah Salvageichard E. Excell Seltzerooper, MD, <Dictator> Lattie HawICHARD E Ewelina Naves MD ELECTRONICALLY SIGNED 07/13/2014 20:41

## 2014-07-25 NOTE — Discharge Summary (Addendum)
PATIENT NAME:  Diane Calderon, Diane Calderon MR#:  [id number] DATE OF BIRTH:  11-23-1955  DATE OF ADMISSION:  07/13/2014 DATE OF DISCHARGE:  07/15/2014  DISCHARGE DIAGNOSES:   1. Acute biliary colic, resolved.  2. Acute biliary pancreatitis.  3. Left leg deep vein thrombosis.  4. Chronic obstructive pulmonary disease.  5. History of gastric sleeve procedure.   6. Recent thyroid and parathyroid surgery.  7. Patient needs exploratory laparoscopy for gallstones.    DISCHARGE MEDICATIONS:   1. Albuterol 90 mcg 2 puffs every 6 hours as needed for wheezing.  2. Symbicort 2 puffs b.i.d.  3. Levothyroxine 150 mcg p.o. daily.  4. (lisinopril 40 mg daily.  5. Coumadin5 mg daily 6. Losartan 50 mg daily. 7. Aspirin 81 mg daily.  8. Acetaminophen /oxycodone 300/5 mg  q 4hr tramadol prescription  >q. 6 hours 20 tablets. called instead of acetaniphen/oxycodone 9. Zofran 4 mg t.i.d. for nausea as needed.   DIET:  Regular diet.  Advance to eat small frequent meals.   CONSULTATIONS:  GI consult with Dr. Midge Minium and surgical consult with Dr. Excell Seltzer.  HOSPITAL COURSE:   1. This is a 59 year old female patient.  Comes in because of epigastric pain, nausea of about 1 day.  Patient found to have elevated lipase of 1495 and has cholelithiasis without cholecystitis.  Patient admitted to the hospitalist service with acute pancreatitis.   (> patient's vitals showed BP of 148/87 and she was admitted for acute pancreatitis and kept n.p.o. and started on IV fluids, IV pain medication.  Patient had an ultrasound.  The patient's liver function showed AST 351, ALT 192, and the patient's alkaline phosphatase was 96 on admission.  She had an ultrasound of the abdomen that showed cholelithiasis without cholecystitis but she had multiple gallstones in the lumen and the largest was 17 mm.  No gallbladder wall thickening.  Seen by Dr. Excell Seltzer from surgery and he suggested that patient can have laparoscopic cholecystectomy when she is  better with her acute pancreatitis but patient prefers to have surgery at [facility] because she had prior surgeries there and her primary doctor is from [facility], so we monitored clinically here.  2. Regarding pancreatitis, seen by GI and they mentioned that the if patient continued to have increased bilirubin and she will need MRCP and they advised clear liquids and IV fluids, IV pain medicine as tolerated.  Patient's lipase improved. nausea/abdominal  pain also improved with conservative management, and the patient's repeat AST 122 and ALT 128, lipase down to 169 on April 20.  Patient remained stable hemodynamically.  We discharged her home and she was advised to follow up with [facility] surgeon for her gallbladder operation.  did not have further GI work up.  3. Left leg DVT.  She had gastric sleeve operation in January of this year.  After that, she was dehydrated and had a left leg DVT and she had IVC filter placed and she is on Coumadin since then.  On admission, she was started on heparin drip and that was discontinued for possible coversion to hemorrhagic  pancreatitis.  Patient had ultrasound of the left leg which showed a persistent DVT in the > common femoral veins in the left leg ,so we restarted the heparin drip and monitored her 1 day. for frurther symptoms of pancreatitis.  Hemoglobin stayed stable, so we advised her to continue Coumadin for at least 6 months.  4. COPD.  She had no wheezing.  Continued her on  her nebulizer and inhalers.  5. History of gastric sleeve operation.  She is advised to follow up with her bariatric surgeon for   chronic nausea.  6. History of recent thyroid and parathyroid surgery.    7. Hypertension.  She is on losartan 50 p.o. daily.   DISCHARGE PHYSICAL EXAMINATION:   VITAL SIGNS:  Temperature 97.7 Fahrenheit, heart rate 82 bpm , blood pressure 156/99, saturations 96% on room air.   CARDIOVASCULAR:   S1, S2, regular. LUNGS:  Clear to auscultation.   ABDOMEN:  Soft, nontender, nondistended.  Bowel sounds present.  EXTREMITIES:  No extremity edema. No cyanosis.  No clubbing.   discharged  in stable condition.   TIME SPENT:  More than 30 minutes.      ____________________________ Katha HammingSnehalatha Melaine Mcphee, MD sk:kc D: 07/17/2014 07:10:21 ET T: 07/17/2014 11:58:07 ET JOB#: [id number]  cc: Katha HammingSnehalatha Adeena Bernabe, MD, <Dictator> Katha HammingSNEHALATHA Ozell Juhasz MD ELECTRONICALLY SIGNED 07/27/2014 23:00

## 2014-07-25 NOTE — H&P (Signed)
PATIENT NAME:  Diane Calderon, CRONIC MR#:  [id number] DATE OF BIRTH:  06/16/1955  DATE OF ADMISSION:  07/13/2014  REFERRING PHYSICIAN: Marjean Donna, MD  PRIMARY CARE PRACTITIONER: Nonlocal, at [facility].   ADMITTING PHYSICIAN: Azucena Freed, MD  CHIEF COMPLAINT: Epigastric pain with nausea, ongoing for the past 1 day.   HISTORY OF PRESENT ILLNESS: A 59 year old African American female with a history of hypertension, hypothyroidism, lupus dermatitis, DVT on Coumadin and recent thyroidectomy done 3 days ago who presents with the complaints of epigastric pain associated with nausea ongoing for the past 1 day. The patient states that she was doing well up until onset of symptoms, which gradually worsened, hence came to the Emergency Room for further evaluation. In the Emergency Room, the patient was evaluated by the ED physician and was found to be having epigastric pain and work-up revealed elevated lipase of 1495 and elevated liver function tests. Further work-up including a right upper quadrant sonogram revealed gallbladder cholelithiasis without evidence of any cholecystitis or CBD dilation. Surgery on call was consulted by the ED physician who assessed the patient and advised admit the patient to the medical unit for pain control at this time pending further evaluation. Hence, hospitalist service was consulted for further management. The patient denies any fever. No vomiting. No diarrhea. No prior such symptoms. The patient states that all these years she has been under the care of [facility] and she had recent thyroid surgery including prior surgeries all at [facility]. Hence, she would like to have her surgery done at [facility] if she needs surgery. The patient received IV pain medications and currently her pain is slightly better, but still continues to have significant pain. No further vomiting.   PAST MEDICAL HISTORY: 1.  Hypertension.  2.  Lupus dermatitis.  3.  Deep venous thrombosis of the left lower  extremity in January 2016, on Coumadin. Recently was on Lovenox up until 3 days ago because of her recent thyroid surgery.  4.  Hypothyroidism following thyroidectomy done at [facility] 3 weeks ago.   PAST SURGICAL HISTORY: 1.  C-section.  2.  Partial hysterectomy.  3.  Gastric sleeve surgery. 4.  Thyroidectomy.  5.  Bilateral tubal ligation.   ALLERGIES: No known drug allergies.   FAMILY HISTORY: Father with prostate cancer, mom with congestive heart failure and diabetes, and sister with colon cancer.   SOCIAL HISTORY: She is single and works as an Passenger transport manager at [employer]. She denies any history of smoking, alcohol, or substance abuse.   HOME MEDICATIONS:  1.  Aspirin 81 mg 1 tablet orally once a day.  2.  Coumadin 5 mg 1 tablet orally once a day at bedtime.  3.  Levothyroxine 150 mcg 1 tablet orally once a day.  4.  Losartan 50 mg 1 tablet orally once a day.  5.  Omeprazole 40 mg 1 tablet orally once a day.   REVIEW OF SYSTEMS:  CONSTITUTIONAL: Negative for fever, chills. No fatigue. No generalized weakness.  EYES: Negative for blurred vision, double vision. No pain, redness or discharge.  EARS, NOSE, AND THROAT: Negative for tinnitus, ear pain, hearing loss, epistaxis or nasal discharge. RESPIRATORY: Negative for cough, wheezing, dyspnea, hemoptysis, or painful respiration.  CARDIOVASCULAR: Negative for chest pain, palpitation, dizziness, syncopal episodes, orthopnea, dyspnea on exertion, or pedal edema.  GASTROINTESTINAL: Positive for nausea and abdominal pain of 1 day duration. Negative for any vomiting. No diarrhea. No constipation.  GENITOURINARY: Negative for dysuria, frequency, urgency, or  hematuria.  ENDOCRINE: Negative for polyuria, nocturia, or heat or cold intolerance.  HEME AND LYMPH: Negative for anemia, easy bruising, bleeding, or swollen glands.  INTEGUMENT: Negative for acne, skin rash, or lesions.  MUSCULOSKELETAL: Negative for neck or back pain. No history of  arthritis or gout.  NEUROLOGICAL: Negative for focal weakness, numbness. No history of CVA, TIA, or seizure disorder.  PSYCHIATRIC: Negative for anxiety, insomnia, or depression.   PHYSICAL EXAMINATION: VITAL SIGNS: Temperature 97.9 degrees Fahrenheit, pulse rate 71 per minute, respirations 16 per minute, blood pressure 190/92, current blood pressure is 148/87, O2 saturation 100% on room air. GENERAL: Well-developed, well-nourished, alert, in mild distress because of epigastric pain.  HEAD: Atraumatic, normocephalic.  EYES: Pupils are equal, reactive to light and accommodation. No conjunctival pallor. No icterus. Extraocular movements intact.  NOSE: No drainage.  EARS: No drainage.  ORAL CAVITY: No mucosal lesions. No exudates.  NECK: Supple. No JVD. No thyromegaly. No carotid bruit. Range of motion of neck within normal limits.  RESPIRATORY: Good respiratory effort. Not using accessory muscles of respiration. Bilateral vesicular breath sounds present. No rales or rhonchi.  CARDIOVASCULAR: S1, S2 regular. No murmurs, gallops, or clicks. Pulses equal at carotid, femoral, and pedal pulses. No peripheral edema.  GASTROINTESTINAL: Abdomen soft. Mild epigastric tenderness present. No guarding. No rigidity. Bowel sounds present and equal in all 4 quadrants.  GENITOURINARY: Deferred.  MUSCULOSKELETAL: No joint tenderness or effusion. Range of motion is adequate. Strength and tone equal bilaterally.  SKIN: Inspection within normal limits.  LYMPHATIC: No cervical lymphadenopathy.  VASCULAR: Good dorsalis pedis and posterior tibial pulses.  NEUROLOGIC: Alert, awake, and oriented x3. Cranial nerves II through XII grossly intact. No sensory deficit. Motor strength 5/5 in both upper and lower extremities. DTRs 2+ bilaterally and symmetrical. Plantars downgoing.  PSYCHIATRIC: Alert, awake, and oriented x3. Judgment and insight adequate. Memory and mood within normal limits.   DIAGNOSTIC DATA: Labs: Serum  glucose 113, BUN 8, creatinine 0.86, sodium 140, potassium 3.6, chloride 107, bicarb 24, calcium 9.8. Lipase 1490. Total protein 7.5, albumin 4, total bili 2.2, alk phos 96, AST 351, ALT 192. Troponin less than 0.03. WBC 5.4, hemoglobin 12.5, hematocrit 39.8, platelet count 188,000. Prothrombin time 20. INR 1.7. PTT 46.2.  Urinalysis: Clear, WBC 0 to 5.  Right upper quadrant abdominal sonogram:  1.  Multiple shadowing echoic stones present within the gallbladder lumen, that is cholelithiasis without sonographic evidence of acute cholecystitis or biliary dilatation. 2.  Hepatic steatosis.   EKG: Normal sinus rhythm with ventricular rate of 65 beats per minute. No acute ST-T changes.   ASSESSMENT AND PLAN:  A 58 year old African American female with a history of hypertension, lupus dermatitis, deep venous thrombosis of left lower extremity in January of 2016, on Coumadin, recent thyroidectomy and hypothyroidism done 3 weeks ago at [facility] who presents with epigastric pain with nausea of 1 day duration, found to have elevated lipase with elevated liver function tests and right upper quadrant sonogram with cholelithiasis. Seen by surgery and advised admission for pain control and further management.  1.  Acute pancreatitis and cholelithiasis. Plan: Admit to medical floor. Keep her n.p.o. IV fluids. IV pain control medications. Followup lipase. Followup surgical consultation. Gastroenterology consult placed for further recommendations. Of note, the patient desires to undergo surgery or any procedure at [facility] since she had all her previous surgeries including recent thyroidectomy at [facility].  2.  History of lupus dermatitis. No acute problem. Monitor.  3.  History of deep  vein thrombosis in January of 2016 of left lower extremity. The patient on Coumadin and INR is subtherapeutic. Of note, the patient was on Lovenox until last Friday following recent thyroid surgery and Coumadin was started back about 3  days ago. Plan: We will start on heparin drip because of subtherapeutic INR in anticipation of possible surgery or procedure. Benefits and side effects of anticoagulation, heparin drip, discussed with the patient. The patient agreeable.  4.  Hypertension. Stable on home medications. Continue same.  5.  Hypothyroidism following thyroidectomy done 3 weeks ago at [facility]. The patient is stable on Synthroid. Continue same.  6.  Deep vein thrombosis prophylaxis. Heparin drip. 7.  Gastrointestinal prophylaxis. Proton pump inhibitor.   CODE STATUS: FULL code.   TIME SPENT: 50 minutes.   ____________________________ Juluis Mire, MD enr:sb D: 07/13/2014 06:02:33 ET T: 07/13/2014 07:29:06 ET JOB#: [id number]  cc: Juluis Mire, MD, <Dictator> PCP - [facility] Juluis Mire MD ELECTRONICALLY SIGNED 07/13/2014 19:26

## 2015-08-22 ENCOUNTER — Encounter: Payer: Self-pay | Admitting: Emergency Medicine

## 2015-08-22 ENCOUNTER — Emergency Department
Admission: EM | Admit: 2015-08-22 | Discharge: 2015-08-22 | Disposition: A | Discharge status: Home / Self Care | Payer: BLUE CROSS/BLUE SHIELD | Attending: Emergency Medicine | Admitting: Emergency Medicine

## 2015-08-22 DIAGNOSIS — J45909 Unspecified asthma, uncomplicated: Secondary | ICD-10-CM | POA: Insufficient documentation

## 2015-08-22 DIAGNOSIS — T7803XA Anaphylactic reaction due to other fish, initial encounter: Secondary | ICD-10-CM | POA: Insufficient documentation

## 2015-08-22 DIAGNOSIS — T782XXA Anaphylactic shock, unspecified, initial encounter: Secondary | ICD-10-CM

## 2015-08-22 DIAGNOSIS — T7840XA Allergy, unspecified, initial encounter: Secondary | ICD-10-CM | POA: Diagnosis present

## 2015-08-22 HISTORY — DX: Unspecified asthma, uncomplicated: J45.909

## 2015-08-22 MED ORDER — FAMOTIDINE IN NACL 20-0.9 MG/50ML-% IV SOLN
20.0000 mg | Freq: Once | INTRAVENOUS | Status: AC
  Administered 2015-08-22: 20 mg via INTRAVENOUS
  Filled 2015-08-22: qty 50

## 2015-08-22 MED ORDER — METHYLPREDNISOLONE SODIUM SUCC 125 MG IJ SOLR
125.0000 mg | Freq: Once | INTRAMUSCULAR | Status: AC
  Administered 2015-08-22: 125 mg via INTRAVENOUS
  Filled 2015-08-22: qty 2

## 2015-08-22 MED ORDER — EPINEPHRINE 0.3 MG/0.3ML IJ SOAJ
0.3000 mg | Freq: Once | INTRAMUSCULAR | Status: AC
  Administered 2015-08-22: 0.3 mg via INTRAMUSCULAR
  Filled 2015-08-22: qty 0.3

## 2015-08-22 MED ORDER — PREDNISONE 20 MG PO TABS: 60.0000 mg | ORAL_TABLET | Freq: Every day | ORAL | Status: AC

## 2015-08-22 MED ORDER — SODIUM CHLORIDE 0.9 % IV BOLUS (SEPSIS)
1000.0000 mL | INTRAVENOUS | Status: AC
  Administered 2015-08-22: 1000 mL via INTRAVENOUS

## 2015-08-22 MED ORDER — DIPHENHYDRAMINE HCL 50 MG/ML IJ SOLN
25.0000 mg | Freq: Once | INTRAMUSCULAR | Status: AC
  Administered 2015-08-22: 25 mg via INTRAVENOUS
  Filled 2015-08-22: qty 1

## 2015-08-22 MED ORDER — EPINEPHRINE 0.3 MG/0.3ML IJ SOAJ: 0.3000 mg | Freq: Once | INTRAMUSCULAR | Status: AC

## 2015-08-22 MED ORDER — ALBUTEROL SULFATE (2.5 MG/3ML) 0.083% IN NEBU
2.5000 mg | INHALATION_SOLUTION | Freq: Once | RESPIRATORY_TRACT | Status: AC
  Administered 2015-08-22: 2.5 mg via RESPIRATORY_TRACT
  Filled 2015-08-22: qty 3

## 2015-08-22 NOTE — Discharge Instructions (Signed)
You have been seen in the Emergency Department (ED) today for a severe allergic reaction.  You have been stable throughout your stay in the Emergency Department.  Please take your medications as prescribed and follow up with your doctor as indicated.  You should also take over-the-counter Benadryl around the clock for the next three days according to the dosing instructions on the package.  Please keep your Epi-Pen with you at all times and use it if experience shortness of breath or difficulty breathing or if you believe you are having a severe allergic reaction.  If you use the Epi-Pen, though, please call 911 afterwards or go immediately to your nearest Emergency Department.  Return to the Emergency Department (ED) if you experience any worsening or new symptoms that concern you.   Anaphylactic Reaction An anaphylactic reaction is a sudden, severe allergic reaction that involves the whole body. It can be life threatening. A hospital stay is often required. People with asthma, eczema, or hay fever are slightly more likely to have an anaphylactic reaction. CAUSES  An anaphylactic reaction may be caused by anything to which you are allergic. After being exposed to the allergic substance, your immune system becomes sensitized to it. When you are exposed to that allergic substance again, an allergic reaction can occur. Common causes of an anaphylactic reaction include:  Medicines.  Foods, especially peanuts, wheat, shellfish, milk, and eggs.  Insect bites or stings.  Blood products.  Chemicals, such as dyes, latex, and contrast material used for imaging tests. SYMPTOMS  When an allergic reaction occurs, the body releases histamine and other substances. These substances cause symptoms such as tightening of the airway. Symptoms often develop within seconds or minutes of exposure. Symptoms may include:  Skin rash or hives.  Itching.  Chest tightness.  Swelling of the eyes, tongue, or  lips.  Trouble breathing or swallowing.  Lightheadedness or fainting.  Anxiety or confusion.  Stomach pains, vomiting, or diarrhea.  Nasal congestion.  A fast or irregular heartbeat (palpitations). DIAGNOSIS  Diagnosis is based on your history of recent exposure to allergic substances, your symptoms, and a physical exam. Your caregiver may also perform blood or urine tests to confirm the diagnosis. TREATMENT  Epinephrine medicine is the main treatment for an anaphylactic reaction. Other medicines that may be used for treatment include antihistamines, steroids, and albuterol. In severe cases, fluids and medicine to support blood pressure may be given through an intravenous line (IV). Even if you improve after treatment, you need to be observed to make sure your condition does not get worse. This may require a stay in the hospital. [place] a medical alert bracelet or necklace stating your allergy.  You and your family must learn how to use an anaphylaxis kit or give an epinephrine injection to temporarily treat an emergency allergic reaction. Always carry your epinephrine injection or anaphylaxis kit with you. This can be lifesaving if you have a severe reaction.  Do not drive or perform tasks after treatment until the medicines used to treat your reaction have worn off, or until your caregiver says it is okay.  If you have hives or a rash:  Take medicines as directed by your caregiver.  You may use an over-the-counter antihistamine (diphenhydramine) as needed.  Apply cold compresses to the skin or take baths in cool water. Avoid hot baths or showers. SEEK MEDICAL CARE IF:   You develop symptoms of an allergic reaction to a new substance. Symptoms may  start right away or minutes later.  You develop a rash, hives, or itching.  You develop new symptoms. SEEK IMMEDIATE MEDICAL CARE IF:   You have swelling of the mouth, difficulty breathing, or  wheezing.  You have a tight feeling in your chest or throat.  You develop hives, swelling, or itching all over your body.  You develop severe vomiting or diarrhea.  You feel faint or pass out. This is an emergency. Use your epinephrine injection or anaphylaxis kit as you have been instructed. Call your local emergency services (911 in [place] Even if you improve after the injection, you need to be examined at a hospital emergency department. MAKE SURE YOU:   Understand these instructions.  Will watch your condition.  Will get help right away if you are not doing well or get worse.   This information is not intended to replace advice given to you by your health care provider. Make sure you discuss any questions you have with your health care provider.   Document Released: 03/12/2005 Document Revised: 03/17/2013 Document Reviewed: 09/22/2014 Elsevier Interactive Patient Education 2016 [employer].  Allergies An allergy is an abnormal reaction to a substance by the body's defense system (immune system). Allergies can develop at any age. WHAT CAUSES ALLERGIES? An allergic reaction happens when the immune system mistakenly reacts to a normally harmless substance, called an allergen, as if it were harmful. The immune system releases antibodies to fight the substance. Antibodies eventually release a chemical called histamine into the bloodstream. The release of histamine is meant to protect the body from infection, but it also causes discomfort. An allergic reaction can be triggered by:  Eating an allergen.  Inhaling an allergen.  Touching an allergen. WHAT TYPES OF ALLERGIES ARE THERE? There are many types of allergies. Common types include:  Seasonal allergies. People with this type of allergy are usually allergic to substances that are only present during certain seasons, such as molds and pollens.  Food allergies.  Drug allergies.  Insect allergies.  Animal dander  allergies. WHAT ARE SYMPTOMS OF ALLERGIES? Possible allergy symptoms include:  Swelling of the lips, face, tongue, mouth, or throat.  Sneezing, coughing, or wheezing.  Nasal congestion.  Tingling in the mouth.  Rash.  Itching.  Itchy, red, swollen areas of skin (hives).  Watery eyes.  Vomiting.  Diarrhea.  Dizziness.  Lightheadedness.  Fainting.  Trouble breathing or swallowing.  Chest tightness.  Rapid heartbeat. HOW ARE ALLERGIES DIAGNOSED? Allergies are diagnosed with a medical and family history and one or more of the following:  Skin tests.  Blood tests.  A food diary. A food diary is a record of all the foods and drinks you have in a day and of all the symptoms you experience.  The results of an elimination diet. An elimination diet involves eliminating foods from your diet and then adding them back in one by one to find out if a certain food causes an allergic reaction. HOW ARE ALLERGIES TREATED? There is no cure for allergies, but allergic reactions can be treated with medicine. Severe reactions usually need to be treated at a hospital. HOW CAN REACTIONS BE PREVENTED? The best way to prevent an allergic reaction is by avoiding the substance you are allergic to. Allergy shots and medicines can also help prevent reactions in some cases. People with severe allergic reactions may be able to prevent a life-threatening reaction called anaphylaxis with a medicine given right after exposure to the allergen.   This information  is not intended to replace advice given to you by your health care provider. Make sure you discuss any questions you have with your health care provider.   Document Released: 06/05/2002 Document Revised: 04/02/2014 Document Reviewed: 12/22/2013 Elsevier Interactive Patient Education [employer].

## 2015-08-22 NOTE — ED Notes (Signed)
Pt arrived to ED from home with c/o allergic reaction. Pt states she had steak and shrimp for dinner around 2 hours ago and around 30 minutes ago she realized her face was swelling and she was having a hard time breathing. Pt states she has had to be intubated due to asthma in the past.

## 2015-08-22 NOTE — ED Provider Notes (Signed)
[facility] Emergency Department Provider Note  ____________________________________________  Time seen: Approximately 1:32 AM  I have reviewed the triage vital signs and the nursing notes.   HISTORY  Chief Complaint Allergic Reaction  History is somewhat limited by the patient's severe distress upon arrival  HPI Diane Calderon is a 60 y.o. female with a past medical history of asthma requiring intubation within the last couple of years who presents for what appears to be a severe allergic reaction.  She reports that she ate steak and shrimp for dinner, which she has had with no issues in the past, about 2 hours prior to the onset of the symptoms.  About 30 minutes ago she awoke and discovered that she had facial swelling and that she was having hard time breathing.  She came right to the emergency Department by private vehicle and when she arrived she was wheezing and having difficulty breathing with facial swelling and some difficulty swallowing.  Her symptoms are severe and relatively acute in onset with rapid worsening.  Nothing is making them better or worse.She denies chest pain, abdominal pain, nausea, vomiting, diarrhea.  Reports a generalized itching red rash.   Past Medical History  Diagnosis Date  . Asthma     There are no active problems to display for this patient.   History reviewed. No pertinent past surgical history.  Current Outpatient Rx  Name  Route  Sig  Dispense  Refill  . EPINEPHrine (EPIPEN 2-PAK) 0.3 mg/0.3 mL IJ SOAJ injection   Intramuscular   Inject 0.3 mLs (0.3 mg total) into the muscle once. Take for severe allergic reaction, then come immediately to the Emergency Department or call 911.   1 Device   0   . predniSONE (DELTASONE) 20 MG tablet   Oral   Take 3 tablets (60 mg total) by mouth daily.   15 tablet   0     Allergies Review of patient's allergies indicates no known allergies.  History reviewed. No  pertinent family history.  Social History Social History  Substance Use Topics  . Smoking status: Never Smoker   . Smokeless tobacco: None  . Alcohol Use: No    Review of Systems Constitutional: No fever/chills Eyes: No visual changes. ENT: No sore throat. Cardiovascular: Denies chest pain. Respiratory: Denies shortness of breath. Gastrointestinal: No abdominal pain.  No nausea, no vomiting.  No diarrhea.  No constipation. Genitourinary: Negative for dysuria. Musculoskeletal: Negative for back pain. Skin: Negative for rash. Neurological: Negative for headaches, focal weakness or numbness.  10-point ROS otherwise negative.  ____________________________________________   PHYSICAL EXAM:  VITAL SIGNS: ED Triage Vitals  Enc Vitals Group     BP --      Pulse --      Resp --      Temp --      Temp src --      SpO2 08/22/15 0130 98 %     Weight --      Height --      Head Cir --      Peak Flow --      Pain Score --      Pain Loc --      Pain Edu? --      Excl. in GC? --     Constitutional: Alert and oriented. Moderate to severe distress from an apparent allergic reaction Eyes: Conjunctivae are normal. PERRL. EOMI. swelling around her left eye that reportedly is acute  Head: Atraumatic.  Nose: Mild congestion Mouth/Throat: Mucous membranes are moist.  Oropharynx non-erythematous.   Neck: No stridor.  No meningeal signs.  No brawny induration.  Patient is clearing her throat and coughing a lot but maintaining her airway Cardiovascular: Normal rate, regular rhythm. Good peripheral circulation. Grossly normal heart sounds.   Respiratory: Increased respiratory effort.  +retractions. Lungs wheezing throughout (mild) Gastrointestinal: Soft and nontender. No distention.  Musculoskeletal: No lower extremity tenderness nor edema. No gross deformities of extremities. Neurologic:  Normal speech and language. No gross focal neurologic deficits are appreciated.  Skin:  Skin is  warm, dry and intact. No rash noted. Psychiatric: Mood and affect are normal. Speech and behavior are normal.  ____________________________________________   LABS (all labs ordered are listed, but only abnormal results are displayed)  Labs Reviewed - No data to display ____________________________________________  EKG  None ____________________________________________  RADIOLOGY   No results found.  ____________________________________________   PROCEDURES  Procedure(s) performed: None  Critical Care performed: Yes, see critical care note(s)   CRITICAL CARE Performed by: Loleta RoseFORBACH, Zuleima Haser   Total critical care time: 60 minutes  Critical care time was exclusive of separately billable procedures and treating other patients.  Critical care was necessary to treat or prevent imminent or life-threatening deterioration.  Critical care was time spent personally by me on the following activities: development of treatment plan with patient and/or surrogate as well as nursing, discussions with consultants, evaluation of patient's response to treatment, examination of patient, obtaining history from patient or surrogate, ordering and performing treatments and interventions, ordering and review of laboratory studies, ordering and review of radiographic studies, pulse oximetry and re-evaluation of patient's condition.  ____________________________________________   INITIAL IMPRESSION / ASSESSMENT AND PLAN / ED COURSE  Pertinent labs & imaging results that were available during my care of the patient were reviewed by me and considered in my medical decision making (see chart for details).  Patient presents with apparent anaphylaxis with some airway compromise.  Given the rapid worsening including a history of asthma requiring intubation I gave her a full course of medications including epinephrine 0.3 mg IM, Solu-Medrol, famotidine, and Benadryl, as well as IV fluids.  We will watch  her carefully on the monitor and pulse oximetry.  ----------------------------------------- 2:18 AM on 08/22/2015 -----------------------------------------  The patient still has some swelling around her left eye but she is breathing comfortably and states that she feels significantly better than she did previously.  I explained that we will need to watch her for minimum of 4 hours and possibly as many as 6.  I let the family know the plan as well.  They understand and agree with the plan.  ----------------------------------------- 4:41 AM on 08/22/2015 -----------------------------------------  She is resting comfortably.  She states that she feels much better than before, still has a very mild sore throat and her left orbit is still a little bit edematous.  Otherwise she feels much better than prior.  ----------------------------------------- 6:11 AM on 08/22/2015 -----------------------------------------  Patient is still resting comfortable.  She states her symptoms feel much better and she no longer has any sort of itching or foreign body sensation in her left eye.  Her left upper eyelid is still swollen but it is mild and she is able to completely open her eye.  She has normal extraocular movement, no conjunctival injection, and no evidence of stye or chalazion of the lid.  I discussed all this with her and I pinned and that is likely still a side effect  of the allergic reaction since it all happened acutely at once.  I offered tetracaine and Wood's lamp evaluation with fluoroscien, but since she has no discomfort from the eyes she prefers to hold off at this time.  Her her discharge instructions and anticipate discharging her after approximately 5 hours of evaluation in the emergency department to make sure she does not have any rebound anaphylaxis.  I have prescribed her an EpiPen and a prednisone burst.  I gave my usual customary discussion and return  precautions.  ----------------------------------------- 7:05 AM on 08/22/2015 -----------------------------------------  Patient has been here nearly 6 hours with no rebound of her symptoms.  She states that she feels fine and her eye is not bothering her even though the left lid is still somewhat swollen.  There is no sign of periorbital or orbital cellulitis.  I believe this is an allergic reaction and she will continue to improve.  I reiterated that she should follow-up with her primary care doctor and/or the [facility] if she continues to have issues. ____________________________________________  FINAL CLINICAL IMPRESSION(S) / ED DIAGNOSES  Final diagnoses:  Anaphylaxis, initial encounter  Acute allergic reaction, initial encounter     MEDICATIONS GIVEN DURING THIS VISIT:  Medications  sodium chloride 0.9 % bolus 1,000 mL (0 mLs Intravenous Stopped 08/22/15 0240)  EPINEPHrine (EPI-PEN) injection 0.3 mg (0.3 mg Intramuscular Given 08/22/15 0134)  methylPREDNISolone sodium succinate (SOLU-MEDROL) 125 mg/2 mL injection 125 mg (125 mg Intravenous Given 08/22/15 0135)  famotidine (PEPCID) IVPB 20 mg premix (0 mg Intravenous Stopped 08/22/15 0155)  diphenhydrAMINE (BENADRYL) injection 25 mg (25 mg Intravenous Given 08/22/15 0135)  albuterol (PROVENTIL) (2.5 MG/3ML) 0.083% nebulizer solution 2.5 mg (2.5 mg Nebulization Given 08/22/15 0139)     NEW OUTPATIENT MEDICATIONS STARTED DURING THIS VISIT:  New Prescriptions   EPINEPHRINE (EPIPEN 2-PAK) 0.3 MG/0.3 ML IJ SOAJ INJECTION    Inject 0.3 mLs (0.3 mg total) into the muscle once. Take for severe allergic reaction, then come immediately to the Emergency Department or call 911.   PREDNISONE (DELTASONE) 20 MG TABLET    Take 3 tablets (60 mg total) by mouth daily.      Note:  This document was prepared using Dragon voice recognition software and may include unintentional dictation errors.   Loleta Rose, MD 08/22/15 [phone]

## 2017-05-15 ENCOUNTER — Other Ambulatory Visit: Payer: Self-pay | Admitting: Family Medicine

## 2017-05-15 DIAGNOSIS — Z1231 Encounter for screening mammogram for malignant neoplasm of breast: Secondary | ICD-10-CM

## 2017-05-27 ENCOUNTER — Inpatient Hospital Stay: Admission: RE | Admit: 2017-05-27 | Payer: BLUE CROSS/BLUE SHIELD | Source: Ambulatory Visit

## 2017-05-30 ENCOUNTER — Ambulatory Visit
Admission: RE | Admit: 2017-05-30 | Discharge: 2017-05-30 | Disposition: A | Discharge status: Home / Self Care | Payer: BLUE CROSS/BLUE SHIELD | Source: Ambulatory Visit | Attending: Family Medicine | Admitting: Family Medicine

## 2017-05-30 DIAGNOSIS — Z1231 Encounter for screening mammogram for malignant neoplasm of breast: Secondary | ICD-10-CM | POA: Insufficient documentation

## 2022-05-24 ENCOUNTER — Emergency Department
Admission: EM | Admit: 2022-05-24 | Discharge: 2022-05-24 | Disposition: A | Discharge status: Home / Self Care | Payer: BC Managed Care – PPO | Attending: Emergency Medicine | Admitting: Emergency Medicine

## 2022-05-24 ENCOUNTER — Encounter: Payer: Self-pay | Admitting: Emergency Medicine

## 2022-05-24 ENCOUNTER — Emergency Department: Payer: BC Managed Care – PPO

## 2022-05-24 ENCOUNTER — Other Ambulatory Visit: Payer: Self-pay

## 2022-05-24 DIAGNOSIS — S161XXA Strain of muscle, fascia and tendon at neck level, initial encounter: Secondary | ICD-10-CM | POA: Insufficient documentation

## 2022-05-24 DIAGNOSIS — M25511 Pain in right shoulder: Secondary | ICD-10-CM | POA: Diagnosis present

## 2022-05-24 DIAGNOSIS — Y92410 Unspecified street and highway as the place of occurrence of the external cause: Secondary | ICD-10-CM | POA: Diagnosis not present

## 2022-05-24 DIAGNOSIS — Y9389 Activity, other specified: Secondary | ICD-10-CM | POA: Insufficient documentation

## 2022-05-24 MED ORDER — PREDNISONE 10 MG PO TABS: 10.0000 mg | ORAL_TABLET | Freq: Every day | ORAL | 0 refills | Status: DC

## 2022-05-24 MED ORDER — CYCLOBENZAPRINE HCL 5 MG PO TABS: 5.0000 mg | ORAL_TABLET | Freq: Three times a day (TID) | ORAL | 0 refills | Status: AC | PRN

## 2022-05-24 MED ORDER — CYCLOBENZAPRINE HCL 10 MG PO TABS
5.0000 mg | ORAL_TABLET | Freq: Once | ORAL | Status: AC
  Administered 2022-05-24: 5 mg via ORAL
  Filled 2022-05-24: qty 1

## 2022-05-24 MED ORDER — ACETAMINOPHEN 500 MG PO TABS
1000.0000 mg | ORAL_TABLET | Freq: Once | ORAL | Status: AC
  Administered 2022-05-24: 1000 mg via ORAL
  Filled 2022-05-24: qty 2

## 2022-05-24 MED ORDER — CYCLOBENZAPRINE HCL 5 MG PO TABS: 5.0000 mg | ORAL_TABLET | Freq: Three times a day (TID) | ORAL | 0 refills | Status: DC | PRN

## 2022-05-24 MED ORDER — PREDNISONE 10 MG PO TABS: 10.0000 mg | ORAL_TABLET | Freq: Every day | ORAL | 0 refills | Status: AC
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
## 2022-05-24 NOTE — Discharge Instructions (Signed)
Please take Tylenol every 6 hours as needed for mild pain.  You may use prednisone over the next 6 days to help with pain and inflammation.  Use muscle relaxer as needed for muscle tightness and spasms.  Return to the ER for any worsening symptoms or any urgent changes in your health

## 2022-06-02 ENCOUNTER — Emergency Department: Payer: BC Managed Care – PPO

## 2022-06-02 ENCOUNTER — Emergency Department
Admission: EM | Admit: 2022-06-02 | Discharge: 2022-06-02 | Disposition: A | Discharge status: Home / Self Care | Payer: BC Managed Care – PPO | Attending: Emergency Medicine | Admitting: Emergency Medicine

## 2022-06-02 ENCOUNTER — Other Ambulatory Visit: Payer: Self-pay

## 2022-06-02 DIAGNOSIS — R0789 Other chest pain: Secondary | ICD-10-CM | POA: Diagnosis present

## 2022-06-02 DIAGNOSIS — Y92410 Unspecified street and highway as the place of occurrence of the external cause: Secondary | ICD-10-CM | POA: Diagnosis not present

## 2022-06-02 MED ORDER — LIDOCAINE 5 % EX PTCH
1.0000 | MEDICATED_PATCH | CUTANEOUS | Status: DC
  Administered 2022-06-02: 1 via TRANSDERMAL
  Filled 2022-06-02: qty 1

## 2022-06-02 MED ORDER — LIDOCAINE 5 % EX PTCH: 1.0000 | MEDICATED_PATCH | Freq: Two times a day (BID) | CUTANEOUS | 0 refills | Status: AC
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
## 2022-06-02 NOTE — Discharge Instructions (Signed)
You may continue to use the Lidoderm patches in addition to Tylenol and ibuprofen per package instructions to help with your symptoms.  Please return for any new, worsening, or change in symptoms or other concerns.  It was a pleasure caring for you today.
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
